# Patient Record
Sex: Male | Born: 1960 | Race: White | Hispanic: No | Marital: Married | Smoking: Never smoker
Health system: Southern US, Community
[De-identification: ages and names within clinical notes are randomized; demographics above are authoritative.]

## PROBLEM LIST (undated history)

## (undated) DIAGNOSIS — E291 Testicular hypofunction: Secondary | ICD-10-CM

## (undated) DIAGNOSIS — I639 Cerebral infarction, unspecified: Secondary | ICD-10-CM

## (undated) DIAGNOSIS — E039 Hypothyroidism, unspecified: Secondary | ICD-10-CM

## (undated) DIAGNOSIS — M47816 Spondylosis without myelopathy or radiculopathy, lumbar region: Secondary | ICD-10-CM

## (undated) DIAGNOSIS — E785 Hyperlipidemia, unspecified: Secondary | ICD-10-CM

## (undated) DIAGNOSIS — N183 Chronic kidney disease, stage 3 unspecified: Secondary | ICD-10-CM

## (undated) DIAGNOSIS — E079 Disorder of thyroid, unspecified: Secondary | ICD-10-CM

## (undated) DIAGNOSIS — M19011 Primary osteoarthritis, right shoulder: Secondary | ICD-10-CM

## (undated) DIAGNOSIS — J3081 Allergic rhinitis due to animal (cat) (dog) hair and dander: Secondary | ICD-10-CM

## (undated) DIAGNOSIS — J45909 Unspecified asthma, uncomplicated: Secondary | ICD-10-CM

## (undated) HISTORY — DX: Hypothyroidism, unspecified: E03.9

## (undated) HISTORY — DX: Primary osteoarthritis, right shoulder: M19.011

## (undated) HISTORY — DX: Hyperlipidemia, unspecified: E78.5

## (undated) HISTORY — DX: Unspecified asthma, uncomplicated: J45.909

## (undated) HISTORY — DX: Allergic rhinitis due to animal (cat) (dog) hair and dander: J30.81

## (undated) HISTORY — DX: Spondylosis without myelopathy or radiculopathy, lumbar region: M47.816

## (undated) HISTORY — DX: Testicular hypofunction: E29.1

## (undated) HISTORY — DX: Disorder of thyroid, unspecified: E07.9

## (undated) HISTORY — DX: Chronic kidney disease, stage 3 unspecified: N18.30

## (undated) HISTORY — PX: TRANSTHORACIC ECHOCARDIOGRAM: SHX275

## (undated) HISTORY — DX: Cerebral infarction, unspecified: I63.9

---

## 2000-01-16 HISTORY — PX: KNEE ARTHROSCOPY: SUR90

## 2000-06-14 ENCOUNTER — Encounter: Payer: Self-pay | Admitting: Internal Medicine

## 2000-06-14 ENCOUNTER — Ambulatory Visit (HOSPITAL_COMMUNITY): Admission: RE | Admit: 2000-06-14 | Discharge: 2000-06-14 | Payer: Self-pay | Admitting: Internal Medicine

## 2001-03-26 ENCOUNTER — Ambulatory Visit (HOSPITAL_COMMUNITY): Admission: RE | Admit: 2001-03-26 | Discharge: 2001-03-26 | Payer: Self-pay | Admitting: Family Medicine

## 2001-03-26 ENCOUNTER — Encounter: Payer: Self-pay | Admitting: Family Medicine

## 2001-08-28 ENCOUNTER — Encounter: Payer: Self-pay | Admitting: Family Medicine

## 2001-08-28 ENCOUNTER — Ambulatory Visit (HOSPITAL_COMMUNITY): Admission: RE | Admit: 2001-08-28 | Discharge: 2001-08-28 | Payer: Self-pay | Admitting: Family Medicine

## 2003-01-11 ENCOUNTER — Ambulatory Visit (HOSPITAL_COMMUNITY): Admission: RE | Admit: 2003-01-11 | Discharge: 2003-01-11 | Payer: Self-pay | Admitting: Family Medicine

## 2003-01-27 ENCOUNTER — Ambulatory Visit (HOSPITAL_COMMUNITY): Admission: RE | Admit: 2003-01-27 | Discharge: 2003-01-27 | Payer: Self-pay | Admitting: Family Medicine

## 2004-05-29 ENCOUNTER — Ambulatory Visit (HOSPITAL_COMMUNITY): Admission: RE | Admit: 2004-05-29 | Discharge: 2004-05-29 | Payer: Self-pay | Admitting: Family Medicine

## 2008-01-17 ENCOUNTER — Emergency Department (HOSPITAL_COMMUNITY): Admission: EM | Admit: 2008-01-17 | Discharge: 2008-01-17 | Payer: Self-pay | Admitting: Emergency Medicine

## 2008-11-18 ENCOUNTER — Ambulatory Visit (HOSPITAL_COMMUNITY): Admission: RE | Admit: 2008-11-18 | Discharge: 2008-11-18 | Payer: Self-pay | Admitting: Family Medicine

## 2010-02-04 ENCOUNTER — Encounter: Payer: Self-pay | Admitting: Family Medicine

## 2010-02-05 ENCOUNTER — Encounter: Payer: Self-pay | Admitting: Family Medicine

## 2012-05-21 ENCOUNTER — Ambulatory Visit (INDEPENDENT_AMBULATORY_CARE_PROVIDER_SITE_OTHER): Payer: BC Managed Care – PPO | Admitting: General Surgery

## 2013-06-01 ENCOUNTER — Telehealth: Payer: Self-pay

## 2013-06-01 NOTE — Telephone Encounter (Signed)
Pt was calling to speak with triage nurse about letter to set up colonoscopy. Please call him back at [phone]

## 2013-06-02 NOTE — Telephone Encounter (Signed)
Tried to call had to leave a message

## 2013-06-03 ENCOUNTER — Encounter: Payer: Self-pay | Admitting: Internal Medicine

## 2013-06-24 NOTE — Telephone Encounter (Signed)
Per [place], pt said he has scheduled colonoscopy with [place], he lives in [place].

## 2013-07-21 ENCOUNTER — Ambulatory Visit (AMBULATORY_SURGERY_CENTER): Payer: Self-pay

## 2013-07-21 VITALS — Ht 74.0 in | Wt 225.0 lb

## 2013-07-21 DIAGNOSIS — Z1211 Encounter for screening for malignant neoplasm of colon: Secondary | ICD-10-CM

## 2013-07-21 MED ORDER — MOVIPREP 100 G PO SOLR: 1.0000 | Freq: Once | ORAL | Status: DC

## 2013-07-21 NOTE — Progress Notes (Signed)
No allergies to eggs or soy No past problems with anesthesia No relatives with problems with anesthesia No home oxygen No diet/weight loss meds  Has email  Emmi instructions given for colonosc

## 2013-08-04 ENCOUNTER — Encounter: Payer: Self-pay | Admitting: Internal Medicine

## 2014-05-18 ENCOUNTER — Other Ambulatory Visit (HOSPITAL_COMMUNITY): Payer: Self-pay | Admitting: Physician Assistant

## 2014-05-18 DIAGNOSIS — K469 Unspecified abdominal hernia without obstruction or gangrene: Secondary | ICD-10-CM

## 2014-05-20 ENCOUNTER — Other Ambulatory Visit (HOSPITAL_COMMUNITY): Payer: PRIVATE HEALTH INSURANCE

## 2014-05-24 ENCOUNTER — Ambulatory Visit (HOSPITAL_COMMUNITY): Payer: PRIVATE HEALTH INSURANCE

## 2014-06-29 ENCOUNTER — Encounter: Payer: No Typology Code available for payment source | Attending: Physician Assistant | Admitting: Dietician

## 2014-06-29 ENCOUNTER — Encounter: Payer: Self-pay | Admitting: Dietician

## 2014-06-29 VITALS — Ht 74.0 in | Wt 221.0 lb

## 2014-06-29 DIAGNOSIS — E785 Hyperlipidemia, unspecified: Secondary | ICD-10-CM | POA: Insufficient documentation

## 2014-06-29 DIAGNOSIS — Z713 Dietary counseling and surveillance: Secondary | ICD-10-CM | POA: Diagnosis not present

## 2014-06-29 NOTE — Patient Instructions (Addendum)
Try switch to diet or sugar free soda, energy drink, or have unsweet tea with splenda or stevia.  Have sausage/bacon about 1-2 x week with breakfast. Aim to fill half of your plate with vegetables at lunch and dinner. Keep a cooler/other food options packed when you go out in the morning. (Protein, carbohydrates, cut up vegetables). Try to eat about every 3-5 hours during the day.  Consider talking to doctor again about sleep at next appointment.
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
## 2016-03-23 ENCOUNTER — Ambulatory Visit: Payer: No Typology Code available for payment source | Admitting: Family Medicine

## 2016-05-11 ENCOUNTER — Other Ambulatory Visit (HOSPITAL_COMMUNITY): Payer: Self-pay | Admitting: Internal Medicine

## 2016-05-11 DIAGNOSIS — M5416 Radiculopathy, lumbar region: Secondary | ICD-10-CM

## 2016-05-17 ENCOUNTER — Ambulatory Visit (HOSPITAL_COMMUNITY): Payer: No Typology Code available for payment source

## 2016-05-17 ENCOUNTER — Encounter (HOSPITAL_COMMUNITY): Payer: Self-pay

## 2016-11-27 ENCOUNTER — Other Ambulatory Visit (HOSPITAL_COMMUNITY): Payer: Self-pay | Admitting: Physician Assistant

## 2016-11-27 ENCOUNTER — Ambulatory Visit (HOSPITAL_COMMUNITY)
Admission: RE | Admit: 2016-11-27 | Discharge: 2016-11-27 | Disposition: A | Discharge status: Home / Self Care | Payer: No Typology Code available for payment source | Source: Ambulatory Visit | Attending: Physician Assistant | Admitting: Physician Assistant

## 2016-11-27 DIAGNOSIS — S83412A Sprain of medial collateral ligament of left knee, initial encounter: Secondary | ICD-10-CM | POA: Insufficient documentation

## 2016-11-27 DIAGNOSIS — M2392 Unspecified internal derangement of left knee: Secondary | ICD-10-CM | POA: Diagnosis present

## 2016-11-27 DIAGNOSIS — S83212A Bucket-handle tear of medial meniscus, current injury, left knee, initial encounter: Secondary | ICD-10-CM | POA: Insufficient documentation

## 2016-11-27 DIAGNOSIS — M25462 Effusion, left knee: Secondary | ICD-10-CM | POA: Diagnosis not present

## 2016-11-27 DIAGNOSIS — R600 Localized edema: Secondary | ICD-10-CM | POA: Diagnosis not present

## 2021-03-02 ENCOUNTER — Emergency Department (HOSPITAL_COMMUNITY): Payer: PRIVATE HEALTH INSURANCE

## 2021-03-02 ENCOUNTER — Emergency Department (HOSPITAL_BASED_OUTPATIENT_CLINIC_OR_DEPARTMENT_OTHER): Payer: PRIVATE HEALTH INSURANCE | Admitting: Radiology

## 2021-03-02 ENCOUNTER — Emergency Department (HOSPITAL_BASED_OUTPATIENT_CLINIC_OR_DEPARTMENT_OTHER)
Admission: EM | Admit: 2021-03-02 | Discharge: 2021-03-02 | Disposition: A | Discharge status: Home / Self Care | Payer: PRIVATE HEALTH INSURANCE | Attending: Emergency Medicine | Admitting: Emergency Medicine

## 2021-03-02 ENCOUNTER — Encounter (HOSPITAL_BASED_OUTPATIENT_CLINIC_OR_DEPARTMENT_OTHER): Payer: Self-pay | Admitting: *Deleted

## 2021-03-02 ENCOUNTER — Emergency Department (HOSPITAL_BASED_OUTPATIENT_CLINIC_OR_DEPARTMENT_OTHER): Payer: PRIVATE HEALTH INSURANCE

## 2021-03-02 ENCOUNTER — Other Ambulatory Visit: Payer: Self-pay

## 2021-03-02 DIAGNOSIS — R251 Tremor, unspecified: Secondary | ICD-10-CM | POA: Insufficient documentation

## 2021-03-02 DIAGNOSIS — R4182 Altered mental status, unspecified: Secondary | ICD-10-CM | POA: Diagnosis not present

## 2021-03-02 DIAGNOSIS — R42 Dizziness and giddiness: Secondary | ICD-10-CM | POA: Insufficient documentation

## 2021-03-02 DIAGNOSIS — R55 Syncope and collapse: Secondary | ICD-10-CM | POA: Insufficient documentation

## 2021-03-02 DIAGNOSIS — I10 Essential (primary) hypertension: Secondary | ICD-10-CM | POA: Insufficient documentation

## 2021-03-02 DIAGNOSIS — Z20822 Contact with and (suspected) exposure to covid-19: Secondary | ICD-10-CM | POA: Diagnosis not present

## 2021-03-02 DIAGNOSIS — J45909 Unspecified asthma, uncomplicated: Secondary | ICD-10-CM | POA: Insufficient documentation

## 2021-03-02 DIAGNOSIS — R110 Nausea: Secondary | ICD-10-CM | POA: Diagnosis not present

## 2021-03-02 DIAGNOSIS — G252 Other specified forms of tremor: Secondary | ICD-10-CM

## 2021-03-02 LAB — COMPREHENSIVE METABOLIC PANEL
ALT: 23 U/L (ref 0–44)
AST: 20 U/L (ref 15–41)
Albumin: 4 g/dL (ref 3.5–5.0)
Alkaline Phosphatase: 31 U/L — ABNORMAL LOW (ref 38–126)
Anion gap: 8 (ref 5–15)
BUN: 11 mg/dL (ref 6–20)
CO2: 30 mmol/L (ref 22–32)
Calcium: 8.6 mg/dL — ABNORMAL LOW (ref 8.9–10.3)
Chloride: 106 mmol/L (ref 98–111)
Creatinine, Ser: 1.54 mg/dL — ABNORMAL HIGH (ref 0.61–1.24)
GFR, Estimated: 51 mL/min — ABNORMAL LOW (ref >60)
Glucose, Bld: 98 mg/dL (ref 70–99)
Potassium: 4.3 mmol/L (ref 3.5–5.1)
Sodium: 144 mmol/L (ref 135–145)
Total Bilirubin: 0.8 mg/dL (ref 0.3–1.2)
Total Protein: 6.3 g/dL — ABNORMAL LOW (ref 6.5–8.1)

## 2021-03-02 LAB — CBC WITH DIFFERENTIAL/PLATELET
Abs Immature Granulocytes: 0.04 10*3/uL (ref 0.00–0.07)
Basophils Absolute: 0.1 10*3/uL (ref 0.0–0.1)
Basophils Relative: 1 %
Eosinophils Absolute: 0.2 10*3/uL (ref 0.0–0.5)
Eosinophils Relative: 2 %
HCT: 52.4 % — ABNORMAL HIGH (ref 39.0–52.0)
Hemoglobin: 16.6 g/dL (ref 13.0–17.0)
Immature Granulocytes: 0 %
Lymphocytes Relative: 4 %
Lymphs Abs: 0.4 10*3/uL — ABNORMAL LOW (ref 0.7–4.0)
MCH: 29.2 pg (ref 26.0–34.0)
MCHC: 31.7 g/dL (ref 30.0–36.0)
MCV: 92.3 fL (ref 80.0–100.0)
Monocytes Absolute: 0.6 10*3/uL (ref 0.1–1.0)
Monocytes Relative: 6 %
Neutro Abs: 8.3 10*3/uL — ABNORMAL HIGH (ref 1.7–7.7)
Neutrophils Relative %: 87 %
Platelets: 280 10*3/uL (ref 150–400)
RBC: 5.68 MIL/uL (ref 4.22–5.81)
RDW: 14.1 % (ref 11.5–15.5)
WBC: 9.5 10*3/uL (ref 4.0–10.5)
nRBC: 0 % (ref 0.0–0.2)

## 2021-03-02 LAB — URINALYSIS, ROUTINE W REFLEX MICROSCOPIC
Bilirubin Urine: NEGATIVE
Glucose, UA: NEGATIVE mg/dL
Ketones, ur: NEGATIVE mg/dL
Leukocytes,Ua: NEGATIVE
Nitrite: NEGATIVE
Protein, ur: 30 mg/dL — AB
Specific Gravity, Urine: 1.023 (ref 1.005–1.030)
pH: 6.5 (ref 5.0–8.0)

## 2021-03-02 LAB — TROPONIN I (HIGH SENSITIVITY)
Troponin I (High Sensitivity): 12 ng/L (ref <18)
Troponin I (High Sensitivity): 13 ng/L (ref <18)

## 2021-03-02 LAB — RESP PANEL BY RT-PCR (FLU A&B, COVID) ARPGX2
Influenza A by PCR: NEGATIVE
Influenza B by PCR: NEGATIVE
SARS Coronavirus 2 by RT PCR: NEGATIVE

## 2021-03-02 LAB — RAPID URINE DRUG SCREEN, HOSP PERFORMED
Amphetamines: NOT DETECTED
Barbiturates: NOT DETECTED
Benzodiazepines: NOT DETECTED
Cocaine: NOT DETECTED
Opiates: NOT DETECTED
Tetrahydrocannabinol: NOT DETECTED

## 2021-03-02 LAB — MAGNESIUM: Magnesium: 2.1 mg/dL (ref 1.7–2.4)

## 2021-03-02 LAB — CBG MONITORING, ED: Glucose-Capillary: 86 mg/dL (ref 70–99)

## 2021-03-02 LAB — TSH: TSH: 4.05 u[IU]/mL (ref 0.350–4.500)

## 2021-03-02 LAB — LIPASE, BLOOD: Lipase: 31 U/L (ref 11–51)

## 2021-03-02 MED ORDER — IOHEXOL 350 MG/ML SOLN
75.0000 mL | Freq: Once | INTRAVENOUS | Status: AC | PRN
  Administered 2021-03-02: 75 mL via INTRAVENOUS

## 2021-03-02 MED ORDER — SODIUM CHLORIDE 0.9 % IV BOLUS
500.0000 mL | Freq: Once | INTRAVENOUS | Status: AC
  Administered 2021-03-02: 500 mL via INTRAVENOUS

## 2021-03-02 NOTE — ED Notes (Signed)
Patient verbalizes understanding of d/c instructions. Opportunities for questions and answers were provided. Pt d/c from ED and ambulated to lobby with wife.  

## 2021-03-02 NOTE — ED Provider Notes (Signed)
[facility] EMERGENC[facility] DEPT Provider Note   CSN: [id number] Arrival date & time: 03/02/21  1024     History  Chief Complaint  Patient presents with   Dizziness    Luis Whitaker is a 61 y.o. male.  61 yo man presents with complaint of nausea, dizziness, and near syncope. 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 He became very dizzy (not positional), and sat down. He started sweating and had an episode of emesis. He decided to leave work and went home to lie down. His wife convinced him to come in to be examined. Patient reports that he has had similar shaking and sweating episodes for several weeks to months. They typically occur at night. He has had intense night sweats to the point of having to change clothes/sheets. He states that he feels "wobbly" today. His PMH is only significant for HTN and asthma. He has not followed with a primary care doctor in several years. He denies tobacco use, EtOH, or other recreational drugs.      Home Medications Prior to Admission medications   Medication Sig Start Date End Date Taking? Authorizing Provider  albuterol (PROVENTIL HFA;VENTOLIN HFA) 108 (90 BASE) MCG/ACT inhaler Inhale 1-2 puffs into the lungs every 6 (six) hours as needed for wheezing or shortness of breath (seasonal with pollen).    [provider]  Hydrocodone-Acetaminophen (LORCET PO) Take by mouth.    [provider]  Montelukast Sodium (SINGULAIR PO) Take 10 mg by mouth daily.    [provider]  MOVIPREP 100 G SOLR Take 1 kit (200 g total) by mouth once. 07/21/13   Pyrtle, Lajuan Lines, MD  Multiple Vitamins-Minerals (CENTRUM SILVER PO) Take 1 tablet by mouth daily.    [provider]      Allergies    Patient has no known allergies.    Review of Systems   Review of Systems  Constitutional:  Positive for chills and fatigue. Negative  for appetite change, fever and unexpected weight change.  HENT:  Negative for postnasal drip, rhinorrhea, sinus pressure, sinus pain, sneezing and sore throat.   Eyes:  Negative for photophobia and visual disturbance.  Respiratory:  Negative for cough, shortness of breath and wheezing.   Cardiovascular:  Negative for chest pain, palpitations and leg swelling.  Gastrointestinal:  Positive for nausea and vomiting. Negative for abdominal distention, abdominal pain, constipation and diarrhea.  Genitourinary:        Reports 2-3 episodes of nocturia regularly  Skin:  Negative for color change and pallor.  Neurological:  Positive for dizziness, tremors and light-headedness. Negative for seizures, syncope, facial asymmetry, speech difficulty, weakness, numbness and headaches.  Psychiatric/Behavioral:  Negative for agitation, behavioral problems, confusion, decreased concentration and dysphoric mood.   All other systems reviewed and are negative.  Physical Exam Updated Vital Signs BP (!) 145/97    Pulse 62    Temp 98.7 F (37.1 C) (Oral)    Resp 17    Wt 104.3 kg    SpO2 96%    BMI 29.53 kg/m  Physical Exam Vitals and nursing note reviewed.  Constitutional:      General: He is not in acute distress.    Appearance: Normal appearance. He is normal weight. He is not ill-appearing, toxic-appearing or diaphoretic.  HENT:     Head: Normocephalic and atraumatic.     Nose: Nose normal.  No congestion or rhinorrhea.     Mouth/Throat:     Mouth: Mucous membranes are moist.     Pharynx: Oropharynx is clear. No oropharyngeal exudate or posterior oropharyngeal erythema.  Eyes:     General: No scleral icterus.       Right eye: No discharge.        Left eye: No discharge.     Extraocular Movements: Extraocular movements intact.     Conjunctiva/sclera: Conjunctivae normal.     Pupils: Pupils are equal, round, and reactive to light.  Cardiovascular:     Rate and Rhythm: Normal rate and regular rhythm.      Pulses: Normal pulses.     Heart sounds: Normal heart sounds. No murmur heard.   No friction rub. No gallop.  Pulmonary:     Effort: Pulmonary effort is normal.     Breath sounds: Normal breath sounds.  Abdominal:     General: Abdomen is flat. Bowel sounds are normal.     Palpations: Abdomen is soft. There is no mass.     Tenderness: There is no abdominal tenderness. There is no right CVA tenderness, left CVA tenderness or guarding.  Musculoskeletal:     Cervical back: Normal range of motion and neck supple. No rigidity.     Right lower leg: No edema.     Left lower leg: No edema.  Lymphadenopathy:     Cervical: No cervical adenopathy.  Skin:    General: Skin is warm and dry.     Capillary Refill: Capillary refill takes less than 2 seconds.     Coloration: Skin is not pale.     Findings: No erythema.  Neurological:     Mental Status: He is alert and oriented to person, place, and time. Mental status is at baseline.     Cranial Nerves: No cranial nerve deficit.     Sensory: No sensory deficit.     Motor: No weakness.     Coordination: Coordination normal.     Deep Tendon Reflexes: Reflexes normal.     Comments: Romberg positive falling back, no pronator drift, broad based gait  Psychiatric:        Mood and Affect: Mood normal.        Behavior: Behavior normal.    ED Results / Procedures / Treatments   Labs (all labs ordered are listed, but only abnormal results are displayed) Labs Reviewed  COMPREHENSIVE METABOLIC PANEL - Abnormal; Notable for the following components:      Result Value   Creatinine, Ser 1.54 (*)    Calcium 8.6 (*)    Total Protein 6.3 (*)    Alkaline Phosphatase 31 (*)    GFR, Estimated 51 (*)    All other components within normal limits  CBC WITH DIFFERENTIAL/PLATELET - Abnormal; Notable for the following components:   HCT 52.4 (*)    Neutro Abs 8.3 (*)    Lymphs Abs 0.4 (*)    All other components within normal limits  URINALYSIS, ROUTINE W REFLEX  MICROSCOPIC - Abnormal; Notable for the following components:   Hgb urine dipstick TRACE (*)    Protein, ur 30 (*)    Bacteria, UA FEW (*)    All other components within normal limits  RESP PANEL BY RT-PCR (FLU A&B, COVID) ARPGX2  URINE CULTURE  LIPASE, BLOOD  MAGNESIUM  RAPID URINE DRUG SCREEN, HOSP PERFORMED  TSH  CBG MONITORING, ED  TROPONIN I (HIGH SENSITIVITY)  TROPONIN I (HIGH SENSITIVITY)  EKG EKG Interpretation  Date/Time:  Thursday March 02 2021 10:34:59 EST Ventricular Rate:  64 PR Interval:  214 QRS Duration: 126 QT Interval:  404 QTC Calculation: 417 R Axis:   10 Text Interpretation: Sinus rhythm Prolonged PR interval IVCD, consider atypical RBBB Nonspecific ST change No prior tracings for comparison Confirmed by Nanda Quinton [phone]) on 03/02/2021 10:55:37 AM  Radiology DG Chest 2 View  Result Date: 03/02/2021 CLINICAL DATA:  Dizziness, near syncope EXAM: CHEST - 2 VIEW COMPARISON:  11/18/2008 FINDINGS: The heart size and mediastinal contours are within normal limits. Both lungs are clear. The visualized skeletal structures are unremarkable. IMPRESSION: No active cardiopulmonary disease. Electronically Signed   By: Elmer Picker M.D.   On: 03/02/2021 11:41   CT Head Wo Contrast  Result Date: 03/02/2021 CLINICAL DATA:  Altered mental status, dizziness EXAM: CT HEAD WITHOUT CONTRAST TECHNIQUE: Contiguous axial images were obtained from the base of the skull through the vertex without intravenous contrast. RADIATION DOSE REDUCTION: This exam was performed according to the departmental dose-optimization program which includes automated exposure control, adjustment of the mA and/or kV according to patient size and/or use of iterative reconstruction technique. COMPARISON:  None. FINDINGS: Brain: No acute intracranial findings are seen. Ventricles are not dilated. Cortical sulci are prominent. There is no focal mass effect. Vascular: Unremarkable. Skull: No fracture  is seen. Sinuses/Orbits: Unremarkable. Other: None IMPRESSION: No acute intracranial findings are seen.  Atrophy. Electronically Signed   By: Elmer Picker M.D.   On: 03/02/2021 11:41    Procedures Procedures    Medications Ordered in ED Medications  sodium chloride 0.9 % bolus 500 mL (has no administration in time range)  sodium chloride 0.9 % bolus 500 mL (0 mLs Intravenous Stopped 03/02/21 1223)    ED Course/ Medical Decision Making/ A&P Clinical Course as of 03/02/21 1235  Thu Mar 02, 2021  1158 CBC reasurringly WNL, Lapse, Mg, and first troponin all WNL. CMP with decreased protein and mildly elevated s cr. Unclear what to make of s cr, less likely dehydration given normal BUN. No previous cr to compare to for baseline, could be AKI. Will give fluids and will need reassesssment. [CM]  9892 Urine with trace hematuria, protein, and bacteria. Will get urine culture. May need protein electrophoresis. [CM]  1194 CXR and CT head with no acute findings. [CM]  1223 Due to significant findings on neuro exam, will recommend transfer to [facility] for MRI brain. Dr. Laverta Baltimore discussed with Dr. Billy Fischer, who accepted. [CM]  28 COVID, flu, and UDS negative [CM]    Clinical Course User Index [CM] Gladys Damme, MD                           Medical Decision Making 61 yo man with no significant PMH who presents with insidious onset of shaking, night sweats, with an episode of near-syncope, dizziness, and emesis today. Ddx is very broad, including cardiac etiology (EKG no STEMI, will obtain trops, cardiac monitoring for arrhythmia- currently NSR), neurologic etiology (posterior/vertebral stroke, TIA, dorsal column pathology with romberg), metabolic causes (with night sweats, will obtain CMP, dizziness will obtain CBC, will also get TSH, UA - esp consider insidious group B symptoms from insidious malignancy like MM/Waldentsromm's; glucose normal), ingestion (UDS pending), or infectious (CXR, low risk  for TB exposure).  Amount and/or Complexity of Data Reviewed Independent Historian: spouse External Data Reviewed: notes. Labs: ordered. Radiology: ordered. ECG/medicine tests: ordered.  Final Clinical Impression(s) / ED Diagnoses Final diagnoses:  Dizziness  Resting tremor    Rx / DC Orders ED Discharge Orders     None         Gladys Damme, MD 03/02/21 1235    Margette Fast, MD 03/02/21 1342

## 2021-03-02 NOTE — ED Provider Notes (Signed)
Care assumed from previous provider pending MRI brain. See their note for full details.  In short, patient is a 61 year old male who presents to the ED due to dizziness, nausea, near syncope.  Patient had a positive Romberg exam.  On physical exam during his initial evaluation patient had a tremor and goal ataxia without any clear focal neurodeficits.  CT head negative for any acute abnormalities.  [facility] transferred to [facility] ED for MRI brain.  Previous attending saw patient and noted that if MRI is negative he felt comfortable patient being discharged with PCP and neurology follow-up. Physical Exam  BP 140/85    Pulse 68    Temp 98.2 F (36.8 C)    Resp 20    Wt 104.3 kg    SpO2 99%    BMI 29.53 kg/m   Physical Exam Vitals and nursing note reviewed.  Constitutional:      General: He is not in acute distress.    Appearance: He is not ill-appearing.  HENT:     Head: Normocephalic.  Eyes:     Pupils: Pupils are equal, round, and reactive to light.  Cardiovascular:     Rate and Rhythm: Normal rate and regular rhythm.     Pulses: Normal pulses.     Heart sounds: Normal heart sounds. No murmur heard.   No friction rub. No gallop.  Pulmonary:     Effort: Pulmonary effort is normal.     Breath sounds: Normal breath sounds.  Abdominal:     General: Abdomen is flat. There is no distension.     Palpations: Abdomen is soft.     Tenderness: There is no abdominal tenderness. There is no guarding or rebound.  Musculoskeletal:        General: Normal range of motion.     Cervical back: Neck supple.  Skin:    General: Skin is warm and dry.  Neurological:     General: No focal deficit present.     Mental Status: He is alert and oriented to person, place, and time.     Cranial Nerves: No cranial nerve deficit.     Comments: +Romberg  Psychiatric:        Mood and Affect: Mood normal.        Behavior: Behavior normal.    Procedures  Procedures  ED Course / MDM   Clinical Course as  of 03/02/21 1727  Thu Mar 02, 2021  1158 CBC reasurringly WNL, Lapse, Mg, and first troponin all WNL. CMP with decreased protein and mildly elevated s cr. Unclear what to make of s cr, less likely dehydration given normal BUN. No previous cr to compare to for baseline, could be AKI. Will give fluids and will need reassesssment. [CM]  [id number] Urine with trace hematuria, protein, and bacteria. Will get urine culture. May need protein electrophoresis. [CM]  [id number] CXR and CT head with no acute findings. [CM]  1223 Due to significant findings on neuro exam, will recommend transfer to [facility] for MRI brain. Dr. Laverta Baltimore discussed with Dr. Billy Fischer, who accepted. [CM]  [id number] COVID, flu, and UDS negative [CM]    Clinical Course User Index [CM] Gladys Damme, MD   Medical Decision Making Amount and/or Complexity of Data Reviewed Independent Historian: spouse    Details: Wife at bedside provided history External Data Reviewed: notes. Labs: ordered. Radiology: ordered and independent interpretation performed. Decision-making details documented in ED Course.  Risk Prescription drug management.   61 year old male presents to the ED due  to dizziness.  Patient transferred to [facility] for MRI brain.  See previous notes for full details.   6:06 PM assessed patient at bedside. +Romberg. No other focal deficits  MRI demonstrates  IMPRESSION: 1. No evidence of acute intracranial abnormality. 2. 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 Tiny remote infarct in the right cerebellum.    7:29 PM Discussed MRI results with Dr. Lorrin Goodell with neurology who recommends CTA head/neck. Will reconsult after results are available for further recommends. Discussed results with patient and wife at bedside.   CTA head and neck negative for any emergent large vessel occlusions or high-grade stenosis. Discussed with Dr. Lorrin Goodell who will see patient.  Upon  reassessment, patient notes he would prefer to go home instead of speak to neurology here. Given no acute CVA and normal CTA head/neck feel this is reasonable. Ambulatory referral placed for neurology. Strict ED precautions discussed with patient. Patient states understanding and agrees to plan. Patient discharged home in no acute distress and stable vitals.  Discussed with Dr. Ronnald Nian who agrees with assessment and plan.        Suzy Bouchard, PA-C 03/02/21 [place], [place], DO 03/02/21 2336

## 2021-03-02 NOTE — ED Notes (Signed)
PT is in MRI . They will bring him when he is done there.

## 2021-03-02 NOTE — ED Notes (Signed)
Patient attempting a urine sample at this time

## 2021-03-02 NOTE — ED Triage Notes (Signed)
Pt states that for several months he has been having shakes and dizziness and night sweats.  Pt wife is here and states that he would not go to the doctor to have this evaluated.  This am he became dizzy, sweaty and vomited. No LOC and no focal weakness of CP or sob with this.

## 2021-03-02 NOTE — ED Notes (Signed)
Blood sent to lab (light green, purple and blue)

## 2021-03-02 NOTE — Discharge Instructions (Addendum)
You were seen in the emergency department today with dizziness and a tremor.  MRI showed previous strokes. I have placed a referral to the neurologist. Expect a phone call within the next week. If you do not hear from them, call to schedule an appointment. Return to the ER for new or worsening symptoms.

## 2021-03-02 NOTE — ED Notes (Signed)
IV secured for POV transfer, patient instructed on where to go. Stable at this time

## 2021-03-03 LAB — URINE CULTURE: Culture: NO GROWTH

## 2021-03-09 ENCOUNTER — Encounter: Payer: Self-pay | Admitting: Neurology

## 2021-03-09 ENCOUNTER — Ambulatory Visit (INDEPENDENT_AMBULATORY_CARE_PROVIDER_SITE_OTHER): Payer: PRIVATE HEALTH INSURANCE | Admitting: Neurology

## 2021-03-09 VITALS — BP 144/87 | Ht 74.0 in | Wt 232.0 lb

## 2021-03-09 DIAGNOSIS — I63412 Cerebral infarction due to embolism of left middle cerebral artery: Secondary | ICD-10-CM | POA: Diagnosis not present

## 2021-03-09 DIAGNOSIS — I639 Cerebral infarction, unspecified: Secondary | ICD-10-CM

## 2021-03-09 DIAGNOSIS — R42 Dizziness and giddiness: Secondary | ICD-10-CM | POA: Diagnosis not present

## 2021-03-09 NOTE — Progress Notes (Signed)
[facility] [address] [place]. [place] [address] [phone]       OFFICE CONSULT NOTE  Mr. Luis Whitaker Date of Birth:  1960-09-05 Medical Record Number:  [id number]   Referring MD: Charmaine Downs, PA-C  Reason for Referral: Abnormal MRI scan  HPI: Mr. Luis Whitaker is a 61 year old pleasant Caucasian male seen today for initial office consultation visit.  He is accompanied by his wife.  History is obtained from them and review of electronic medical records and opossum reviewed pertinent available imaging films in PACS.  He has no significant past medical history except hyperlipidemia and asthma.  He presented to the ER on 03/02/2021 with several months history of intermittent episode of transient shakes, dizziness and night sweats.  He feels that his balance is off and he may fall down and has to hold on to something and sit or stand.  He is at times has nausea and vomits.  There has been no loss of consciousness, seizure activity, headache, vertigo, focal extremity weakness or numbness or loss of vision.  He denies any prior history of stroke or TIA but MRI scan of the brain on 03/02/2021 showed old left parieto-occipital area of encephalomalacia and gliosis as well as small area of gliosis in left parietal lobe also consistent with remote age infarct.  As third area of right cerebellar infarct has been mentioned by the radiologist but upon my review of the images it appears to be dilated CSF space.  CT angiogram of the brain and neck were both obtained and showed no significant large vessel intracranial stenosis or extracranial occlusion.  He has not had any recent lipid profile hemoglobin A1c checked.  Urine drug screen was negative.  WBC count was unremarkable.  Comprehensive metabolic panel was significant only for slightly elevated creatinine of 1.54.  UA was unremarkable.  Patient denies any history of palpitations, atrial fibrillation, cardiac arrhythmias, syncopal episodes.   He has no history of coronary artery disease or cardiac stents or heart attacks.  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  He denies history of snoring or obstructive sleep apnea.  He is stated he had some concerns several years ago but then lost a lot of weight and this is no longer a concern.  There is no family history of strokes or heart attacks at a young age in his family.  ROS:   14 system review of systems is positive for dizziness, tremors, shaking, sweating, night sweats all other systems negative  PMH:  Past Medical History:  Diagnosis Date   Asthma    Cat allergies    Hyperlipidemia     Social History:  Social History   Socioeconomic History   Marital status: Married    Spouse name: Not on file   Number of children: Not on file   Years of education: Not on file   Highest education level: Not on file  Occupational History   Not on file  Tobacco Use   Smoking status: Never   Smokeless tobacco: Never  Substance and Sexual Activity   Alcohol use: No   Drug use: No   Sexual activity: Not on file  Other Topics Concern   Not on file  Social History Narrative   Not on file   Social Determinants of Health   Financial Resource Strain: Not on file  Food Insecurity: Not on file  Transportation Needs: Not on file  Physical Activity: Not on file  Stress: Not  on file  Social Connections: Not on file  Intimate Partner Violence: Not on file    Medications:   Current Outpatient Medications on File Prior to Visit  Medication Sig Dispense Refill   albuterol (PROVENTIL HFA;VENTOLIN HFA) 108 (90 BASE) MCG/ACT inhaler Inhale 1-2 puffs into the lungs every 6 (six) hours as needed for wheezing or shortness of breath (seasonal with pollen).     Multiple Vitamins-Minerals (CENTRUM SILVER PO) Take 1 tablet by mouth daily.     No current facility-administered medications on file prior to visit.    Allergies:  No Known Allergies  Physical Exam General: well  developed, well nourished pleasant middle-age Caucasian male, seated, in no evident distress Head: head normocephalic and atraumatic.   Neck: supple with no carotid or supraclavicular bruits Cardiovascular: regular rate and rhythm, no murmurs Musculoskeletal: no deformity Skin:  no rash/petichiae Vascular:  Normal pulses all extremities  Neurologic Exam Mental Status: Awake and fully alert. Oriented to place and time. Recent and remote memory intact. Attention span, concentration and fund of knowledge appropriate. Mood and affect appropriate.  Cranial Nerves: Fundoscopic exam reveals sharp disc margins. Pupils equal, briskly reactive to light. Extraocular movements full without nystagmus. Visual fields full to confrontation. Hearing intact. Facial sensation intact. Face, tongue, palate moves normally and symmetrically.  Motor: Normal bulk and tone. Normal strength in all tested extremity muscles.  No resting or action tremor. Sensory.: intact to touch , pinprick , position and vibratory sensation.  Coordination: Rapid alternating movements normal in all extremities. Finger-to-nose and heel-to-shin performed accurately bilaterally. Gait and Station: Arises from chair without difficulty. Stance is normal. Gait demonstrates normal stride length and balance . Able to heel, toe and tandem walk without difficulty.  Reflexes: 1+ and symmetric. Toes downgoing.   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     PLAN:I had a long d/w patient and his wife about his recent dizzy spells and MRI findings of silent cryptogenic strokes, risk for recurrent stroke/TIAs, personally independently reviewed imaging studies and stroke evaluation results and  answered questions.Startaspirin 81 mg daily  for secondary stroke prevention and maintain strict control of hypertension with blood pressure goal below 130/90, diabetes with hemoglobin A1c goal below 6.5% and lipids with LDL cholesterol goal below 70 mg/dL. I also advised the patient to eat a healthy diet with plenty of whole grains, cereals, fruits and vegetables, exercise regularly and maintain ideal body weight .check lipid profile, hemoglobin A1c, echocardiogram and refer to cardiology for loop recorder insertion for paroxysmal A-fib.  Also check thyroid function tests and B12.  Followup in the future with me in 3 months or call earlier if necessary.  Greater than 50% time during this 45-minute consultation visit was spent on counseling and coordination of care about his silent cryptogenic strokes and discussion about stroke evaluation, prevention and treatment and answering questions Antony Contras, MD  Note: This document was prepared with digital dictation and possible smart phrase technology. Any transcriptional errors that result from this process are unintentional.

## 2021-03-09 NOTE — Patient Instructions (Signed)
I had a long d/w patient and his wife about his recent dizzy spells and MRI findings of silent cryptogenic strokes, risk for recurrent stroke/TIAs, personally independently reviewed imaging studies and stroke evaluation results and answered questions.Startaspirin 81 mg daily  for secondary stroke prevention and maintain strict control of hypertension with blood pressure goal below 130/90, diabetes with hemoglobin A1c goal below 6.5% and lipids with LDL cholesterol goal below 70 mg/dL. I also advised the patient to eat a healthy diet with plenty of whole grains, cereals, fruits and vegetables, exercise regularly and maintain ideal body weight .check lipid profile, hemoglobin A1c, echocardiogram and refer to cardiology for loop recorder insertion for paroxysmal A-fib.  Also check thyroid function tests and B12.  Followup in the future with me in 3 months or call earlier if necessary. Stroke Prevention Some medical conditions and behaviors can lead to a higher chance of having a stroke. You can help prevent a stroke by eating healthy, exercising, not smoking, and managing any medical conditions you have. Stroke is a leading cause of functional impairment. Primary prevention is particularly important because a majority of strokes are first-time events. Stroke changes the lives of not only those who experience a stroke but also their family and other caregivers. How can this condition affect me? A stroke is a medical emergency and should be treated right away. A stroke can lead to brain damage and can sometimes be life-threatening. If a person gets medical treatment right away, there is a better chance of surviving and recovering from a stroke. What can increase my risk? The following medical conditions may increase your risk of a stroke: Cardiovascular disease. High blood pressure (hypertension). Diabetes. High cholesterol. Sickle cell disease. Blood clotting disorders (hypercoagulable  state). Obesity. Sleep disorders (obstructive sleep apnea). Other risk factors include: Being older than age 5. Having a history of blood clots, stroke, or mini-stroke (transient ischemic attack, TIA). Genetic factors, such as race, ethnicity, or a family history of stroke. Smoking cigarettes or using other tobacco products. Taking birth control pills, especially if you also use tobacco. Heavy use of alcohol or drugs, especially cocaine and methamphetamine. Physical inactivity. What actions can I take to prevent this? Manage your health conditions High cholesterol levels. Eating a healthy diet is important for preventing high cholesterol. If cholesterol cannot be managed through diet alone, you may need to take medicines. Take any prescribed medicines to control your cholesterol as told by your health care provider. Hypertension. To reduce your risk of stroke, try to keep your blood pressure below 130/80. Eating a healthy diet and exercising regularly are important for controlling blood pressure. If these steps are not enough to manage your blood pressure, you may need to take medicines. Take any prescribed medicines to control hypertension as told by your health care provider. Ask your health care provider if you should monitor your blood pressure at home. Have your blood pressure checked every year, even if your blood pressure is normal. Blood pressure increases with age and some medical conditions. Diabetes. Eating a healthy diet and exercising regularly are important parts of managing your blood sugar (glucose). If your blood sugar cannot be managed through diet and exercise, you may need to take medicines. Take any prescribed medicines to control your diabetes as told by your health care provider. Get evaluated for obstructive sleep apnea. Talk to your health care provider about getting a sleep evaluation if you snore a lot or have excessive sleepiness. Make sure that any other  medical  conditions you have, such as atrial fibrillation or atherosclerosis, are managed. Nutrition Follow instructions from your health care provider about what to eat or drink to help manage your health condition. These instructions may include: Reducing your daily calorie intake. Limiting how much salt (sodium) you use to 1,500 milligrams (mg) each day. Using only healthy fats for cooking, such as olive oil, canola oil, or sunflower oil. Eating healthy foods. You can do this by: Choosing foods that are high in fiber, such as whole grains, and fresh fruits and vegetables. Eating at least 5 servings of fruits and vegetables a day. Try to fill one-half of your plate with fruits and vegetables at each meal. Choosing lean protein foods, such as lean cuts of meat, poultry without skin, fish, tofu, beans, and nuts. Eating low-fat dairy products. Avoiding foods that are high in sodium. This can help lower blood pressure. Avoiding foods that have saturated fat, trans fat, and cholesterol. This can help prevent high cholesterol. Avoiding processed and prepared foods. Counting your daily carbohydrate intake.  Lifestyle If you drink alcohol: Limit how much you have to: 0-1 drink a day for women who are not pregnant. 0-2 drinks a day for men. Know how much alcohol is in your drink. In the [place] one drink equals one 12 oz bottle of beer (39mL), one 5 oz glass of wine (164mL), or one 1 oz glass of hard liquor (74mL). Do not use any products that contain nicotine or tobacco. These products include cigarettes, chewing tobacco, and vaping devices, such as e-cigarettes. If you need help quitting, ask your health care provider. Avoid secondhand smoke. Do not use drugs. Activity  Try to stay at a healthy weight. Get at least 30 minutes of exercise on most days, such as: Fast walking. Biking. Swimming. Medicines Take over-the-counter and prescription medicines only as told by your health care  provider. Aspirin or blood thinners (antiplatelets or anticoagulants) may be recommended to reduce your risk of forming blood clots that can lead to stroke. Avoid taking birth control pills. Talk to your health care provider about the risks of taking birth control pills if: You are over 61 years old. You smoke. You get very bad headaches. You have had a blood clot. Where to find more information American Stroke Association: www.strokeassociation.org Get help right away if: You or a loved one has any symptoms of a stroke. "BE FAST" is an easy way to remember the main warning signs of a stroke: B - Balance. Signs are dizziness, sudden trouble walking, or loss of balance. E - Eyes. Signs are trouble seeing or a sudden change in vision. F - Face. Signs are sudden weakness or numbness of the face, or the face or eyelid drooping on one side. A - Arms. Signs are weakness or numbness in an arm. This happens suddenly and usually on one side of the body. S - Speech. Signs are sudden trouble speaking, slurred speech, or trouble understanding what people say. T - Time. Time to call emergency services. Write down what time symptoms started. You or a loved one has other signs of a stroke, such as: A sudden, severe headache with no known cause. Nausea or vomiting. Seizure. These symptoms may represent a serious problem that is an emergency. Do not wait to see if the symptoms will go away. Get medical help right away. Call your local emergency services (911 in the [place] Do not drive yourself to the hospital. Summary You can help to prevent a stroke by  eating healthy, exercising, not smoking, limiting alcohol intake, and managing any medical conditions you may have. Do not use any products that contain nicotine or tobacco. These include cigarettes, chewing tobacco, and vaping devices, such as e-cigarettes. If you need help quitting, ask your health care provider. Remember "BE FAST" for warning signs of a  stroke. Get help right away if you or a loved one has any of these signs. This information is not intended to replace advice given to you by your health care provider. Make sure you discuss any questions you have with your health care provider. Document Revised: 08/03/2019 Document Reviewed: 08/03/2019 Elsevier Patient Education  [place].

## 2021-03-15 ENCOUNTER — Other Ambulatory Visit: Payer: Self-pay | Admitting: *Deleted

## 2021-03-15 ENCOUNTER — Telehealth: Payer: Self-pay | Admitting: Neurology

## 2021-03-15 DIAGNOSIS — I63412 Cerebral infarction due to embolism of left middle cerebral artery: Secondary | ICD-10-CM

## 2021-03-15 DIAGNOSIS — I639 Cerebral infarction, unspecified: Secondary | ICD-10-CM

## 2021-03-15 NOTE — Telephone Encounter (Signed)
Called [employer] insurance plan spoke with Aram Beecham she states PA is not required for the ECHO. Messaged Lupita Leash to get patient scheduled. ?

## 2021-03-15 NOTE — Telephone Encounter (Signed)
Order needs to be corrected in order to get patient scheduled. ?

## 2021-03-15 NOTE — Telephone Encounter (Signed)
When I Kennon Rounds to schedule it she said she couldn't see it, so what I was informed was it needs to be ordered under [facility]. ?

## 2021-03-21 ENCOUNTER — Other Ambulatory Visit: Payer: Self-pay | Admitting: Neurology

## 2021-03-21 ENCOUNTER — Telehealth: Payer: Self-pay | Admitting: *Deleted

## 2021-03-21 LAB — LIPID PANEL
Chol/HDL Ratio: 7.9 ratio — ABNORMAL HIGH (ref 0.0–5.0)
Cholesterol, Total: 189 mg/dL (ref 100–199)
HDL: 24 mg/dL — ABNORMAL LOW (ref >39)
LDL Chol Calc (NIH): 148 mg/dL — ABNORMAL HIGH (ref 0–99)
Triglycerides: 94 mg/dL (ref 0–149)
VLDL Cholesterol Cal: 17 mg/dL (ref 5–40)

## 2021-03-21 LAB — HEMOGLOBIN A1C
Est. average glucose Bld gHb Est-mCnc: 108 mg/dL
Hgb A1c MFr Bld: 5.4 % (ref 4.8–5.6)

## 2021-03-21 LAB — TSH+T3+FREE T4+T3 FREE
Free T-3: 3.4 pg/mL
Free T4 by Dialysis: 0.59 ng/dL — ABNORMAL LOW
TSH: 9.5 uU/mL — ABNORMAL HIGH
Triiodothyronine (T-3), Serum: 86 ng/dL

## 2021-03-21 LAB — VITAMIN B2, WHOLE BLOOD: Vitamin B2, Whole Blood: 307 ug/L (ref 137–370)

## 2021-03-21 MED ORDER — ATORVASTATIN CALCIUM 80 MG PO TABS: 80.0000 mg | ORAL_TABLET | Freq: Every day | ORAL | 11 refills | Status: DC

## 2021-03-21 NOTE — Telephone Encounter (Signed)
-----   Message from Micki Riley, MD sent at 03/21/2021  9:31 AM EST ----- ?Kindly inform the patient that cholesterol profile is quite unsatisfactory with bad cholesterol being elevated and given his abnormal MRI showing silent strokes I would recommend he start cholesterol medicine Lipitor 80 mg daily to lower his cholesterol and reduce risk of future strokes and heart attacks.  Thyroid hormone levels are also abnormal and he needs to see his primary care physician to be started on appropriate thyroid medications.  Vitamin B12 levels are normal.  Screening test for diabetes was satisfactory ?

## 2021-03-21 NOTE — Telephone Encounter (Signed)
Pt verified by name and DOB,  normal results given per provider, pt voiced understanding all question answered.  ? ?Pt agrees with plan, please send in medication to pts pharmacy. ?

## 2021-03-21 NOTE — Progress Notes (Signed)
Kindly inform the patient that cholesterol profile is quite unsatisfactory with bad cholesterol being elevated and given his abnormal MRI showing silent strokes I would recommend he start cholesterol medicine Lipitor 80 mg daily to lower his cholesterol and reduce risk of future strokes and heart attacks.  Thyroid hormone levels are also abnormal and he needs to see his primary care physician to be started on appropriate thyroid medications.  Vitamin B12 levels are normal.  Screening test for diabetes was satisfactory

## 2021-03-30 ENCOUNTER — Other Ambulatory Visit (HOSPITAL_COMMUNITY): Payer: PRIVATE HEALTH INSURANCE

## 2021-04-03 ENCOUNTER — Other Ambulatory Visit: Payer: Self-pay

## 2021-04-03 ENCOUNTER — Ambulatory Visit (HOSPITAL_COMMUNITY)
Admission: RE | Admit: 2021-04-03 | Discharge: 2021-04-03 | Disposition: A | Discharge status: Home / Self Care | Payer: PRIVATE HEALTH INSURANCE | Source: Ambulatory Visit | Attending: Neurology | Admitting: Neurology

## 2021-04-03 DIAGNOSIS — I63412 Cerebral infarction due to embolism of left middle cerebral artery: Secondary | ICD-10-CM | POA: Insufficient documentation

## 2021-04-03 DIAGNOSIS — I517 Cardiomegaly: Secondary | ICD-10-CM | POA: Diagnosis not present

## 2021-04-03 LAB — ECHOCARDIOGRAM COMPLETE
AR max vel: 5.25 cm2
AV Peak grad: 6.1 mmHg
Ao pk vel: 1.24 m/s
Area-P 1/2: 2.73 cm2
S' Lateral: 2.9 cm
Single Plane A4C EF: 42.5 %

## 2021-06-27 ENCOUNTER — Ambulatory Visit: Payer: PRIVATE HEALTH INSURANCE | Admitting: Neurology

## 2022-02-21 ENCOUNTER — Encounter: Payer: Self-pay | Admitting: Family Medicine

## 2022-02-21 ENCOUNTER — Ambulatory Visit (INDEPENDENT_AMBULATORY_CARE_PROVIDER_SITE_OTHER): Payer: 59 | Admitting: Family Medicine

## 2022-02-21 VITALS — BP 135/83 | HR 70 | Temp 98.1°F | Ht 74.0 in | Wt 242.6 lb

## 2022-02-21 DIAGNOSIS — S46001A Unspecified injury of muscle(s) and tendon(s) of the rotator cuff of right shoulder, initial encounter: Secondary | ICD-10-CM | POA: Diagnosis not present

## 2022-02-21 DIAGNOSIS — M25511 Pain in right shoulder: Secondary | ICD-10-CM

## 2022-02-21 DIAGNOSIS — G8929 Other chronic pain: Secondary | ICD-10-CM

## 2022-02-21 DIAGNOSIS — Z125 Encounter for screening for malignant neoplasm of prostate: Secondary | ICD-10-CM

## 2022-02-21 DIAGNOSIS — E7800 Pure hypercholesterolemia, unspecified: Secondary | ICD-10-CM

## 2022-02-21 DIAGNOSIS — M67921 Unspecified disorder of synovium and tendon, right upper arm: Secondary | ICD-10-CM | POA: Diagnosis not present

## 2022-02-21 DIAGNOSIS — M67911 Unspecified disorder of synovium and tendon, right shoulder: Secondary | ICD-10-CM | POA: Diagnosis not present

## 2022-02-21 DIAGNOSIS — Z0000 Encounter for general adult medical examination without abnormal findings: Secondary | ICD-10-CM | POA: Diagnosis not present

## 2022-02-21 DIAGNOSIS — E039 Hypothyroidism, unspecified: Secondary | ICD-10-CM | POA: Diagnosis not present

## 2022-02-21 LAB — CBC WITH DIFFERENTIAL/PLATELET
Basophils Absolute: 0.1 10*3/uL (ref 0.0–0.1)
Basophils Relative: 2.1 % (ref 0.0–3.0)
Eosinophils Absolute: 0.3 10*3/uL (ref 0.0–0.7)
Eosinophils Relative: 4 % (ref 0.0–5.0)
HCT: 49.7 % (ref 39.0–52.0)
Hemoglobin: 16.8 g/dL (ref 13.0–17.0)
Lymphocytes Relative: 8.2 % — ABNORMAL LOW (ref 12.0–46.0)
Lymphs Abs: 0.5 10*3/uL — ABNORMAL LOW (ref 0.7–4.0)
MCHC: 33.9 g/dL (ref 30.0–36.0)
MCV: 95.3 fl (ref 78.0–100.0)
Monocytes Absolute: 0.5 10*3/uL (ref 0.1–1.0)
Monocytes Relative: 7.9 % (ref 3.0–12.0)
Neutro Abs: 5.2 10*3/uL (ref 1.4–7.7)
Neutrophils Relative %: 77.8 % — ABNORMAL HIGH (ref 43.0–77.0)
Platelets: 249 10*3/uL (ref 150.0–400.0)
RBC: 5.21 Mil/uL (ref 4.22–5.81)
RDW: 14.3 % (ref 11.5–15.5)
WBC: 6.7 10*3/uL (ref 4.0–10.5)

## 2022-02-21 LAB — COMPREHENSIVE METABOLIC PANEL
ALT: 29 U/L (ref 0–53)
AST: 17 U/L (ref 0–37)
Albumin: 4.3 g/dL (ref 3.5–5.2)
Alkaline Phosphatase: 30 U/L — ABNORMAL LOW (ref 39–117)
BUN: 10 mg/dL (ref 6–23)
CO2: 26 mEq/L (ref 19–32)
Calcium: 9 mg/dL (ref 8.4–10.5)
Chloride: 108 mEq/L (ref 96–112)
Creatinine, Ser: 1.41 mg/dL (ref 0.40–1.50)
GFR: 53.95 mL/min — ABNORMAL LOW (ref >60.00)
Glucose, Bld: 79 mg/dL (ref 70–99)
Potassium: 4.2 mEq/L (ref 3.5–5.1)
Sodium: 143 mEq/L (ref 135–145)
Total Bilirubin: 0.8 mg/dL (ref 0.2–1.2)
Total Protein: 6.2 g/dL (ref 6.0–8.3)

## 2022-02-21 LAB — PSA, MEDICARE: PSA: 1.44 ng/ml (ref 0.10–4.00)

## 2022-02-21 LAB — LIPID PANEL
Cholesterol: 218 mg/dL — ABNORMAL HIGH (ref 0–200)
HDL: 24.2 mg/dL — ABNORMAL LOW (ref >39.00)
LDL Cholesterol: 176 mg/dL — ABNORMAL HIGH (ref 0–99)
NonHDL: 194.01
Total CHOL/HDL Ratio: 9
Triglycerides: 92 mg/dL (ref 0.0–149.0)
VLDL: 18.4 mg/dL (ref 0.0–40.0)

## 2022-02-21 LAB — TSH: TSH: 2.69 u[IU]/mL (ref 0.35–5.50)

## 2022-02-21 LAB — HEMOGLOBIN A1C: Hgb A1c MFr Bld: 5.1 % (ref 4.6–6.5)

## 2022-02-21 MED ORDER — LEVOTHYROXINE SODIUM 50 MCG PO TABS: 50.0000 ug | ORAL_TABLET | Freq: Every day | ORAL | 1 refills | Status: DC

## 2022-02-21 NOTE — Progress Notes (Signed)
Office Note 02/21/2022  CC:  Chief Complaint  Patient presents with   Establish Care   Medical Management of Chronic Issues    Thyroid; still having issue with night sweats, chills, fatigue   HPI:  Luis Whitaker is a 62 y.o. male who is here to establish care and health maintenance exam, and discuss hypothyroidism and right shoulder pain. Patient's most recent primary MD: Rowan Blase, [place] records in epic/healthlink EMR were reviewed prior to or during today's visit.  Back in February 2023 he went to the ER for some dizziness and sweating and symptoms that were not consistent with stroke. An MRI brain was abnormal, showing signs of old stroke.  He was started on statin and aspirin.  Neurologist ordered echo, which was normal.  Also ordered outpatient rhythm monitoring which patient chose to not yet. He has had no further episodes/problems related to this. He has chosen not to take statin due to concern for possible side effects.  At some point around that time he was found to have elevated TSH and was started on levothyroxine 50 mcg daily.  His symptoms resolved(night sweats, cold chills, fatigue.  However, lately sx's are re-emerging, mild intensity. Has some intermittent snoring that is almost always related to nasal congestion, no witnessed apnea.   Past Medical History:  Diagnosis Date   Acquired hypothyroidism    Asthma    Exercise-induced   CVA (cerebral vascular accident) ([place])    02/2021--abnormal MRI scan showing remote age left parietal occipital and left parietal embolic silent infarcts of cryptogenic etiology.  Echo was good.  Event monitor ordered but not done   Hyperlipidemia    Patient declines statin   Lumbar spondylosis     Past Surgical History:  Procedure Laterality Date   KNEE ARTHROSCOPY  01/16/2000   bilat   TRANSTHORACIC ECHOCARDIOGRAM     02/2021 normal except Grd I DD, also aortic root dilation 44 mm    Family History  Problem  Relation Age of Onset   Colon cancer Neg Hx    Pancreatic cancer Neg Hx    Rectal cancer Neg Hx    Stomach cancer Neg Hx     Social History   Socioeconomic History   Marital status: Married    Spouse name: Not on file   Number of children: Not on file   Years of education: Not on file   Highest education level: Not on file  Occupational History   Not on file  Tobacco Use   Smoking status: Never   Smokeless tobacco: Never  Substance and Sexual Activity   Alcohol use: No   Drug use: No   Sexual activity: Not on file  Other Topics Concern   Not on file  Social History Narrative   Married, 3 children.   Originally from [place].   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   No T/A/Ds   Lifts weights and does cardio daily.   Social Determinants of Health   Financial Resource Strain: Not on file  Food Insecurity: Not on file  Transportation Needs: Not on file  Physical Activity: Not on file  Stress: Not on file  Social Connections: Not on file  Intimate Partner Violence: Not on file    Outpatient Encounter Medications as of 02/21/2022  Medication Sig   aspirin EC 81 MG tablet Take 81 mg by mouth daily. Swallow whole.   [DISCONTINUED] levothyroxine (SYNTHROID) 50 MCG tablet Take 50 mcg by mouth daily.  albuterol (PROVENTIL HFA;VENTOLIN HFA) 108 (90 BASE) MCG/ACT inhaler Inhale 1-2 puffs into the lungs every 6 (six) hours as needed for wheezing or shortness of breath (seasonal with pollen). (Patient not taking: Reported on 02/21/2022)   levothyroxine (SYNTHROID) 50 MCG tablet Take 1 tablet (50 mcg total) by mouth daily.   [DISCONTINUED] atorvastatin (LIPITOR) 80 MG tablet Take 1 tablet (80 mg total) by mouth daily.   [DISCONTINUED] Multiple Vitamins-Minerals (CENTRUM SILVER PO) Take 1 tablet by mouth daily.   No facility-administered encounter medications on file as of 02/21/2022.    Allergies  Allergen Reactions   Penicillins Rash     Review of Systems  Constitutional:  Positive for chills and fatigue. Negative for appetite change and fever.  HENT:  Negative for congestion, dental problem, ear pain and sore throat.   Eyes:  Negative for discharge, redness and visual disturbance.  Respiratory:  Negative for cough, chest tightness, shortness of breath and wheezing.   Cardiovascular:  Negative for chest pain, palpitations and leg swelling.  Gastrointestinal:  Negative for abdominal pain, blood in stool, diarrhea, nausea and vomiting.  Genitourinary:  Negative for difficulty urinating, dysuria, flank pain, frequency, hematuria and urgency.  Musculoskeletal:  Positive for arthralgias (R shoulder). Negative for back pain, joint swelling, myalgias and neck stiffness.  Skin:  Negative for pallor and rash.  Neurological:  Negative for dizziness, speech difficulty, weakness and headaches.  Hematological:  Negative for adenopathy. Does not bruise/bleed easily.  Psychiatric/Behavioral:  Negative for confusion and sleep disturbance. The patient is not nervous/anxious.    PE; Blood pressure 135/83, pulse 70, temperature 98.1 F (36.7 C), height 6\' 2"  (1.88 m), weight 242 lb 9.6 oz (110 kg), SpO2 95 %.Body mass index is 31.15 kg/m.  Physical Exam  Gen: Alert, well appearing.  Patient is oriented to person, place, time, and situation. AFFECT: pleasant, lucid thought and speech. ENT: Ears: EACs clear, normal epithelium.  TMs with good light reflex and landmarks bilaterally.  Eyes: no injection, icteris, swelling, or exudate.  EOMI, PERRLA. Nose: no drainage or turbinate edema/swelling.  No injection or focal lesion.  Mouth: lips without lesion/swelling.  Oral mucosa pink and moist.  Dentition intact and without obvious caries or gingival swelling.  Oropharynx without erythema, exudate, or swelling.  Neck: supple/nontender.  No LAD, mass, or TM.  Carotid pulses 2+ bilaterally, without bruits. CV: RRR, no m/r/g.   LUNGS: CTA bilat,  nonlabored resps, good aeration in all lung fields. ABD: soft, NT, ND, BS normal.  No hepatospenomegaly or mass.  No bruits. EXT: no clubbing, cyanosis, or edema.  Musculoskeletal: Right shoulder with mild tenderness to palpation around the acromion and over long head of biceps tendon.  Positive Hawkins and Neer's, positive speeds.  Positive O'Brien's.  Painful abduction in the 30 degree to 150 degree arc, negative drop sign.  Question of some subluxation with popping on apprehension testing.  Instability testing of questionable validity given patient's very muscle-bound body habitus.  Otherwise, no joint swelling, erythema, warmth, or tenderness.  ROM of all joints intact. Skin - no sores or suspicious lesions or rashes or color changes  Pertinent labs:  Last CBC Lab Results  Component Value Date   WBC 9.5 03/02/2021   HGB 16.6 03/02/2021   HCT 52.4 (H) 03/02/2021   MCV 92.3 03/02/2021   MCH 29.2 03/02/2021   RDW 14.1 03/02/2021   PLT 280 32/20/2542   Last metabolic panel Lab Results  Component Value Date   GLUCOSE 98 03/02/2021  NA 144 03/02/2021   K 4.3 03/02/2021   CL 106 03/02/2021   CO2 30 03/02/2021   BUN 11 03/02/2021   CREATININE 1.54 (H) 03/02/2021   GFRNONAA 51 (L) 03/02/2021   CALCIUM 8.6 (L) 03/02/2021   PROT 6.3 (L) 03/02/2021   ALBUMIN 4.0 03/02/2021   BILITOT 0.8 03/02/2021   ALKPHOS 31 (L) 03/02/2021   AST 20 03/02/2021   ALT 23 03/02/2021   ANIONGAP 8 03/02/2021   Last lipids Lab Results  Component Value Date   CHOL 189 03/09/2021   HDL 24 (L) 03/09/2021   LDLCALC 148 (H) 03/09/2021   TRIG 94 03/09/2021   CHOLHDL 7.9 (H) 03/09/2021   Last hemoglobin A1c Lab Results  Component Value Date   HGBA1C 5.4 03/09/2021   Last thyroid functions Lab Results  Component Value Date   TSH 4.050 03/02/2021   ASSESSMENT AND PLAN:   New patient, establishing care.  #1 health maintenance exam: Reviewed age and gender appropriate health maintenance  issues (prudent diet, regular exercise, health risks of tobacco and excessive alcohol, use of seatbelts, fire alarms in home, use of sunscreen).  Also reviewed age and gender appropriate health screening as well as vaccine recommendations. Vaccines: Patient declines Tdap and Shingrix for now. Labs: Nonfasting health panel, PSA Prostate ca screening: PSA today Colon ca screening: Patient declines colon cancer screening at this time.  #2 hypothyroidism. I do not know what his TSH was at the time of diagnosis. Will get all labs. His symptoms went away with 50 mcg daily levothyroxine. However, over the last couple months his symptoms have gradually begin returning and mild intensity. Check TSH today.  #3 right shoulder pain, chronic and progressive. History of traumatic injury skiing. Exam suggestive of rotator cuff impingement, biceps tendinitis/tendinopathy, as well as possible labral injury. Question shoulder instability (? Dislocation at the time of acute injury?). Check right shoulder radiographs. Refer to physical therapy. Decided against steroid injection today.  Will consider this if not improved in 6 to 8 weeks. Additional consideration would be MRI if not improving. (Bedside MS[facility] ultrasound today: Anechoic fluid distending long head of biceps tendon sheath, bicep tendon thickening noted, no tear.  AC joint mild capsular distention and degenerative changes.  No rotator cuff impingement noted on dynamic imaging.  Diffuse rotator cuff tendon thickening with some hypoechoic changes, no definite tear.  Some thickening of the subacromial/subdeltoid bursal fat but no significant bursal fluid. Posterior view of glenohumeral joint showed no effusion, no obvious labral injury).  An After Visit Summary was printed and given to the patient.  Return for 6-8 week f/u R shoulder.  Signed:  Crissie Sickles, MD           02/21/2022

## 2022-02-23 ENCOUNTER — Ambulatory Visit
Admission: RE | Admit: 2022-02-23 | Discharge: 2022-02-23 | Disposition: A | Discharge status: Home / Self Care | Payer: 59 | Source: Ambulatory Visit | Attending: Family Medicine | Admitting: Family Medicine

## 2022-02-23 DIAGNOSIS — M67921 Unspecified disorder of synovium and tendon, right upper arm: Secondary | ICD-10-CM

## 2022-02-23 DIAGNOSIS — S46001A Unspecified injury of muscle(s) and tendon(s) of the rotator cuff of right shoulder, initial encounter: Secondary | ICD-10-CM

## 2022-02-23 DIAGNOSIS — M19011 Primary osteoarthritis, right shoulder: Secondary | ICD-10-CM | POA: Diagnosis not present

## 2022-02-23 DIAGNOSIS — G8929 Other chronic pain: Secondary | ICD-10-CM

## 2022-02-23 DIAGNOSIS — E039 Hypothyroidism, unspecified: Secondary | ICD-10-CM

## 2022-02-23 DIAGNOSIS — M67911 Unspecified disorder of synovium and tendon, right shoulder: Secondary | ICD-10-CM

## 2022-03-01 DIAGNOSIS — M25511 Pain in right shoulder: Secondary | ICD-10-CM | POA: Diagnosis not present

## 2022-03-05 DIAGNOSIS — M25511 Pain in right shoulder: Secondary | ICD-10-CM | POA: Diagnosis not present

## 2022-03-07 DIAGNOSIS — M25511 Pain in right shoulder: Secondary | ICD-10-CM | POA: Diagnosis not present

## 2022-03-12 DIAGNOSIS — M25511 Pain in right shoulder: Secondary | ICD-10-CM | POA: Diagnosis not present

## 2022-03-20 DIAGNOSIS — M25511 Pain in right shoulder: Secondary | ICD-10-CM | POA: Diagnosis not present

## 2022-03-22 DIAGNOSIS — M25511 Pain in right shoulder: Secondary | ICD-10-CM | POA: Diagnosis not present

## 2022-03-26 DIAGNOSIS — M25511 Pain in right shoulder: Secondary | ICD-10-CM | POA: Diagnosis not present

## 2022-04-12 ENCOUNTER — Encounter: Payer: Self-pay | Admitting: Family Medicine

## 2022-04-12 ENCOUNTER — Ambulatory Visit (INDEPENDENT_AMBULATORY_CARE_PROVIDER_SITE_OTHER): Payer: 59 | Admitting: Family Medicine

## 2022-04-12 VITALS — BP 133/72 | HR 59 | Temp 98.8°F | Ht 74.0 in | Wt 240.2 lb

## 2022-04-12 DIAGNOSIS — M67911 Unspecified disorder of synovium and tendon, right shoulder: Secondary | ICD-10-CM

## 2022-04-12 DIAGNOSIS — M7541 Impingement syndrome of right shoulder: Secondary | ICD-10-CM | POA: Diagnosis not present

## 2022-04-12 DIAGNOSIS — M25511 Pain in right shoulder: Secondary | ICD-10-CM

## 2022-04-12 DIAGNOSIS — S46001A Unspecified injury of muscle(s) and tendon(s) of the rotator cuff of right shoulder, initial encounter: Secondary | ICD-10-CM | POA: Diagnosis not present

## 2022-04-12 MED ORDER — TRIAMCINOLONE ACETONIDE 40 MG/ML IJ SUSP
40.0000 mg | Freq: Once | INTRAMUSCULAR | Status: AC
  Administered 2022-04-12: 40 mg via INTRA_ARTICULAR

## 2022-04-12 NOTE — Progress Notes (Signed)
OFFICE VISIT  04/12/2022  CC:  Chief Complaint  Patient presents with   Follow-up    Shoulder pain (R)    Patient is a 62 y.o. male who presents for 6-week follow-up chronic right shoulder pain. A/P as of last visit: " Right shoulder pain, chronic and progressive. History of traumatic injury skiing. Exam suggestive of rotator cuff impingement, biceps tendinitis/tendinopathy, as well as possible labral injury. Question shoulder instability (? Dislocation at the time of acute injury?). Check right shoulder radiographs. Refer to physical therapy. Decided against steroid injection today.  Will consider this if not improved in 6 to 8 weeks.  INTERIM HX: Right shoulder radiographs show glenohumeral degenerative changes. PT has been helping some.  He got some dry needling with this.  He has been doing his home exercises. Still hurts to lay on it.  Still hurting remarkably when AB ducting  Past Medical History:  Diagnosis Date   Acquired hypothyroidism    Asthma    Exercise-induced   CVA (cerebral vascular accident) ([place])    02/2021--abnormal MRI scan showing remote age left parietal occipital and left parietal embolic silent infarcts of cryptogenic etiology.  Echo was good.  Event monitor ordered but not done   Hyperlipidemia    Patient declines statin   Lumbar spondylosis     Past Surgical History:  Procedure Laterality Date   KNEE ARTHROSCOPY  01/16/2000   bilat   TRANSTHORACIC ECHOCARDIOGRAM     02/2021 normal except Grd I DD, also aortic root dilation 44 mm    Outpatient Medications Prior to Visit  Medication Sig Dispense Refill   albuterol (PROVENTIL HFA;VENTOLIN HFA) 108 (90 BASE) MCG/ACT inhaler Inhale 1-2 puffs into the lungs every 6 (six) hours as needed for wheezing or shortness of breath (seasonal with pollen).     aspirin EC 81 MG tablet Take 81 mg by mouth daily. Swallow whole.     levothyroxine (SYNTHROID) 50 MCG tablet Take 1 tablet (50 mcg total) by mouth  daily. 90 tablet 1   No facility-administered medications prior to visit.    Allergies  Allergen Reactions   Penicillins Rash    Review of Systems As per HPI  PE:    04/12/2022    8:04 AM 02/21/2022   10:02 AM 03/09/2021    7:49 AM  Vitals with BMI  Height 6\' 2"  6\' 2"  6\' 2"   Weight 240 lbs 3 oz 242 lbs 10 oz 232 lbs  BMI 30.83 [id number]  Systolic [id number] [id number]  Diastolic 72 83 87  Pulse 59 70      Physical Exam  General: Alert and well-appearing. Right shoulder with positive speeds, positive Hawkins, equivocal Neer's.  O'Brien's negative.  Significant pain with abduction up to 90 degrees where he is then unable to pass.  At 90 degrees of abduction is very difficult to externally rotate due to pain.  LABS:  Last CBC Lab Results  Component Value Date   WBC 6.7 02/21/2022   HGB 16.8 02/21/2022   HCT 49.7 02/21/2022   MCV 95.3 02/21/2022   MCH 29.2 03/02/2021   RDW 14.3 02/21/2022   PLT 249.0 [id number]   Last metabolic panel Lab Results  Component Value Date   GLUCOSE 79 02/21/2022   NA 143 02/21/2022   K 4.2 02/21/2022   CL 108 02/21/2022   CO2 26 02/21/2022   BUN 10 02/21/2022   CREATININE 1.41 02/21/2022   GFRNONAA 51 (L) 03/02/2021   CALCIUM 9.0 02/21/2022   PROT  6.2 02/21/2022   ALBUMIN 4.3 02/21/2022   BILITOT 0.8 02/21/2022   ALKPHOS 30 (L) 02/21/2022   AST 17 02/21/2022   ALT 29 02/21/2022   ANIONGAP 8 03/02/2021   Last lipids Lab Results  Component Value Date   CHOL 218 (H) 02/21/2022   HDL 24.20 (L) 02/21/2022   LDLCALC 176 (H) 02/21/2022   TRIG 92.0 02/21/2022   CHOLHDL 9 02/21/2022   Last hemoglobin A1c Lab Results  Component Value Date   HGBA1C 5.1 02/21/2022   Last thyroid functions Lab Results  Component Value Date   TSH 2.69 02/21/2022   IMPRESSION AND PLAN:  Subacute pain of right shoulder. Subacromial impingement.  He also has some glenohumeral arthritis on x-ray. He is improving some with PT but still prominent pain  and inability to do certain weight lifting that he really likes to do. Discussed steroid injection today and he wanted to proceed with this. No advanced imaging at this time.  Ultrasound-guided injection is preferred based on studies that show increased duration, increased effect, greater accuracy, decreased procedural pain, increased response rate, and decreased cost with ultrasound-guided versus blind injection. Procedure: Real-time ultrasound guided injection of Right subacrom/subdelt bursa. Device: [employer] informed consent obtained.  Timeout conducted.  No overlying erythema, induration, or other signs of local infection. After sterile prep with Betadine, injected 3 cc of 2% lidocaine without epinephrine followed by mix of 3cc 2% lidocaine + 40mg  kenalog.  Injectate seen filling subacrom bursa. Patient tolerated the procedure well.  No immediate complications.  Post-injection care discussed. Advised to call if fever/chills, erythema, drainage, or persistent bleeding.  Impression: Technically successful ultrasound-guided injection.  An After Visit Summary was printed and given to the patient.  FOLLOW UP: No follow-ups on file.  Signed:  Crissie Sickles, MD           04/12/2022

## 2022-07-02 ENCOUNTER — Encounter: Payer: Self-pay | Admitting: Family Medicine

## 2022-07-02 ENCOUNTER — Ambulatory Visit (INDEPENDENT_AMBULATORY_CARE_PROVIDER_SITE_OTHER): Payer: 59 | Admitting: Family Medicine

## 2022-07-02 VITALS — BP 144/87 | HR 71 | Wt 235.0 lb

## 2022-07-02 DIAGNOSIS — G8929 Other chronic pain: Secondary | ICD-10-CM

## 2022-07-02 DIAGNOSIS — K429 Umbilical hernia without obstruction or gangrene: Secondary | ICD-10-CM | POA: Diagnosis not present

## 2022-07-02 DIAGNOSIS — M25511 Pain in right shoulder: Secondary | ICD-10-CM | POA: Diagnosis not present

## 2022-07-02 DIAGNOSIS — M19011 Primary osteoarthritis, right shoulder: Secondary | ICD-10-CM | POA: Diagnosis not present

## 2022-07-02 DIAGNOSIS — M7541 Impingement syndrome of right shoulder: Secondary | ICD-10-CM

## 2022-07-02 NOTE — Progress Notes (Signed)
OFFICE VISIT  07/02/2022  CC:  Chief Complaint  Patient presents with   Shoulder Pain    Pt also has belly button hernia he wants to ask about.     Patient is a 62 y.o. male who presents for 19-month follow-up right shoulder pain. A/P as of last visit: "Subacute pain of right shoulder. Subacromial impingement.  He also has some glenohumeral arthritis on x-ray. He is improving some with PT but still prominent pain and inability to do certain weight lifting that he really likes to do. Discussed steroid injection today and he wanted to proceed with this. No advanced imaging at this time."  INTERIM HX: His shoulder pain improved significantly and lasted about a month.  The pain then returned to the same level and is bothering him significantly. He hears a popping with range of motion and increased pain with reaching out and up.  Most the pain is in the anterior aspect of the shoulder.  Has had an umbilical hernia for about 10 years.  In the last year or so he notes that it is much more sensitive to touch.  When it is pressed inward it elicits pain in the area as well as radiating down into the suprapubic region.   Past Medical History:  Diagnosis Date   Acquired hypothyroidism    Asthma    Exercise-induced   Chronic renal insufficiency, stage 3 (moderate) ([facility])    CVA (cerebral vascular accident) ([facility])    02/2021--abnormal MRI scan showing remote age left parietal occipital and left parietal embolic silent infarcts of cryptogenic etiology.  Echo was good.  Event monitor ordered but not done   Hyperlipidemia    Patient declines statin   Lumbar spondylosis     Past Surgical History:  Procedure Laterality Date   KNEE ARTHROSCOPY  01/16/2000   bilat   TRANSTHORACIC ECHOCARDIOGRAM     02/2021 normal except Grd I DD, also aortic root dilation 44 mm    Outpatient Medications Prior to Visit  Medication Sig Dispense Refill   albuterol (PROVENTIL HFA;VENTOLIN HFA) 108 (90 BASE) MCG/ACT  inhaler Inhale 1-2 puffs into the lungs every 6 (six) hours as needed for wheezing or shortness of breath (seasonal with pollen).     aspirin EC 81 MG tablet Take 81 mg by mouth daily. Swallow whole.     levothyroxine (SYNTHROID) 50 MCG tablet Take 1 tablet (50 mcg total) by mouth daily. 90 tablet 1   No facility-administered medications prior to visit.    Allergies  Allergen Reactions   Penicillins Rash    Review of Systems As per HPI  PE:    07/02/2022    2:10 PM 04/12/2022    8:04 AM 02/21/2022   10:02 AM  Vitals with BMI  Height  6\' 2"  6\' 2"   Weight 235 lbs 240 lbs 3 oz 242 lbs 10 oz  BMI  30.83 31.13  Systolic 144 133 409  Diastolic 87 72 83  Pulse 71 59 70    Physical Exam  Gen: Alert, well appearing.  Patient is oriented to person, place, time, and situation. AFFECT: pleasant, lucid thought and speech. Abdomen: Approximately 2 to 3 cm umbilical hernia palpable, moderate sensitivity to palpation.  Reducible. Right shoulder has some limitation of range of motion in abduction as well as external rotation.  Crepitus noted.  Tenderness to palpation over the long head of the biceps. Positive Hawkins and O'Brien's.  Positive speeds.   LABS:  Last metabolic panel Lab Results  Component Value Date   GLUCOSE 79 02/21/2022   NA 143 02/21/2022   K 4.2 02/21/2022   CL 108 02/21/2022   CO2 26 02/21/2022   BUN 10 02/21/2022   CREATININE 1.41 02/21/2022   GFRNONAA 51 (L) 03/02/2021   CALCIUM 9.0 02/21/2022   PROT 6.2 02/21/2022   ALBUMIN 4.3 02/21/2022   BILITOT 0.8 02/21/2022   ALKPHOS 30 (L) 02/21/2022   AST 17 02/21/2022   ALT 29 02/21/2022   ANIONGAP 8 03/02/2021     Lab Results  Component Value Date   TSH 2.69 02/21/2022    IMPRESSION AND PLAN:  #1 right shoulder pain for approximately 18 months now. Exam consistent with rotator cuff impingement as well as biceps tendinopathy. 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 Began with traumatic injury while skiing. He described possible brief dislocation at the time. I am concerned about labral injury. Will refer to orthopedics.  #2 umbilical hernia.  Causing some pain/discomfort now. Will refer to general surgery.  #3 night sweats. Greater than 1 year now.  No abnormal weight loss or other red flag symptoms at this time. General lab panel, including TSH, normal 4 months ago. No further workup at this time.  Will reassess this in 3 months.  An After Visit Summary was printed and given to the patient.  FOLLOW UP: Return in about 3 months (around 10/02/2022) for f/u night sweats.  Signed:  Santiago Bumpers, MD           07/02/2022

## 2022-07-06 DIAGNOSIS — M25511 Pain in right shoulder: Secondary | ICD-10-CM | POA: Diagnosis not present

## 2022-07-20 DIAGNOSIS — K429 Umbilical hernia without obstruction or gangrene: Secondary | ICD-10-CM | POA: Diagnosis not present

## 2022-08-27 ENCOUNTER — Other Ambulatory Visit: Payer: Self-pay | Admitting: Family Medicine

## 2022-08-30 ENCOUNTER — Encounter (INDEPENDENT_AMBULATORY_CARE_PROVIDER_SITE_OTHER): Payer: Self-pay

## 2022-08-31 ENCOUNTER — Other Ambulatory Visit: Payer: Self-pay | Admitting: Surgery

## 2022-08-31 DIAGNOSIS — K420 Umbilical hernia with obstruction, without gangrene: Secondary | ICD-10-CM | POA: Diagnosis not present

## 2022-09-26 ENCOUNTER — Other Ambulatory Visit: Payer: Self-pay | Admitting: Family Medicine

## 2022-10-03 ENCOUNTER — Ambulatory Visit: Payer: 59 | Admitting: Family Medicine

## 2022-10-23 DIAGNOSIS — K429 Umbilical hernia without obstruction or gangrene: Secondary | ICD-10-CM | POA: Diagnosis not present

## 2022-10-25 ENCOUNTER — Other Ambulatory Visit: Payer: Self-pay | Admitting: Family Medicine

## 2022-10-25 ENCOUNTER — Telehealth: Payer: Self-pay | Admitting: Family Medicine

## 2022-10-25 MED ORDER — LEVOTHYROXINE SODIUM 50 MCG PO TABS: 50.0000 ug | ORAL_TABLET | Freq: Every day | ORAL | 0 refills | Status: DC

## 2022-10-25 NOTE — Telephone Encounter (Signed)
Pt should have enough until next provider appt 10/14

## 2022-10-25 NOTE — Telephone Encounter (Signed)
Prescription Request  10/25/2022  LOV: 07/02/2022  What is the name of the medication or equipment? levothyroxine (SYNTHROID) 50 MCG tablet   Have you contacted your pharmacy to request a refill? Yes   Which pharmacy would you like this sent to?  [id number] - [place], [place] - [address] [place] [address] Phone: [phone] Fax: [phone]    Patient notified that their request is being sent to the clinical staff for review and that they should receive a response within 2 business days.   Please advise at Mobile [phone] (mobile)

## 2022-10-29 ENCOUNTER — Encounter: Payer: Self-pay | Admitting: Family Medicine

## 2022-10-29 ENCOUNTER — Ambulatory Visit (INDEPENDENT_AMBULATORY_CARE_PROVIDER_SITE_OTHER): Payer: 59 | Admitting: Family Medicine

## 2022-10-29 VITALS — BP 106/66 | HR 57 | Wt 224.0 lb

## 2022-10-29 DIAGNOSIS — G8929 Other chronic pain: Secondary | ICD-10-CM | POA: Diagnosis not present

## 2022-10-29 DIAGNOSIS — M19011 Primary osteoarthritis, right shoulder: Secondary | ICD-10-CM | POA: Diagnosis not present

## 2022-10-29 DIAGNOSIS — M25511 Pain in right shoulder: Secondary | ICD-10-CM | POA: Diagnosis not present

## 2022-10-29 DIAGNOSIS — R61 Generalized hyperhidrosis: Secondary | ICD-10-CM

## 2022-10-29 DIAGNOSIS — M7541 Impingement syndrome of right shoulder: Secondary | ICD-10-CM | POA: Diagnosis not present

## 2022-10-29 MED ORDER — TRIAMCINOLONE ACETONIDE 40 MG/ML IJ SUSP
40.0000 mg | Freq: Once | INTRAMUSCULAR | Status: AC
Start: 2022-10-29 — End: 2022-10-29
  Administered 2022-10-29: 40 mg via INTRAMUSCULAR

## 2022-10-29 NOTE — Progress Notes (Signed)
OFFICE VISIT  10/29/2022  CC:  Chief Complaint  Patient presents with   Night Sweats    F/U. Pt states he has not had any except one within the last 2 weeks;     Patient is a 62 y.o. male who presents for 17-month follow-up night sweats. A/P as of last visit: " night sweats. Greater than 1 year now.  No abnormal weight loss or other red flag symptoms at this time. General lab panel, including TSH, normal 4 months ago. No further workup at this time.  Will reassess this in 3 months."  INTERIM HX: Still with night sweats several days per week.  Says he starts shaking and will feel cold and then he will eventually break out in a sweat.  Temperatures measured during this time are normal. No bodyaches, headaches, abnormal weight loss, appetite loss, or cough. No known trigger.  Denies much anticipatory anxiety problems.  Chronic right shoulder pain.  Has had radiographs confirming some osteoarthritis.  His exam in the past has shown signs of post osteoarthritic pain as well as subacromial impingement. Injections have been helpful in the past.  Orthopedist saw him and said steroid injection every 3 months could be done and eventually may need surgery. Pain worse the last several weeks.  Hurts him at night.  Hurts him to abduct and externally rotate.  No neck pain or radiation down the arm.  No arm paresthesias  ROS as above, plus--> no CP, no SOB, no wheezing, no dizziness, no rashes, no melena/hematochezia.  No polyuria or polydipsia.  No myalgias.  No joint pain other than right shoulder.  No focal weakness, paresthesias, or tremors.  No acute vision or hearing abnormalities.  No dysuria or unusual/new urinary urgency or frequency.  No recent changes in lower legs. No n/v/d or abd pain.  No palpitations.      Past Medical History:  Diagnosis Date   Acquired hypothyroidism    Asthma    Exercise-induced   Chronic renal insufficiency, stage 3 (moderate) (HCC)    CVA (cerebral vascular  accident) (HCC)    02/2021--abnormal MRI scan showing remote age left parietal occipital and left parietal embolic silent infarcts of cryptogenic etiology.  Echo was good.  Event monitor ordered but not done   Hyperlipidemia    Patient declines statin   Lumbar spondylosis     Past Surgical History:  Procedure Laterality Date   KNEE ARTHROSCOPY  01/16/2000   bilat   TRANSTHORACIC ECHOCARDIOGRAM     02/2021 normal except Grd I DD, also aortic root dilation 44 mm    Outpatient Medications Prior to Visit  Medication Sig Dispense Refill   albuterol (PROVENTIL HFA;VENTOLIN HFA) 108 (90 BASE) MCG/ACT inhaler Inhale 1-2 puffs into the lungs every 6 (six) hours as needed for wheezing or shortness of breath (seasonal with pollen).     aspirin EC 81 MG tablet Take 81 mg by mouth daily. Swallow whole.     levothyroxine (SYNTHROID) 50 MCG tablet Take 1 tablet (50 mcg total) by mouth daily. 30 tablet 0   oxyCODONE (OXY IR/ROXICODONE) 5 MG immediate release tablet Take 5 mg by mouth every 6 (six) hours as needed for moderate pain or severe pain. (Patient not taking: Reported on 10/29/2022)     No facility-administered medications prior to visit.    Allergies  Allergen Reactions   Penicillins Rash    Review of Systems As per HPI  PE:    10/29/2022    8:21 AM 10/29/2022  8:19 AM 07/02/2022    2:10 PM  Vitals with BMI  Weight  224 lbs 235 lbs  Systolic 106 142 562  Diastolic 66 85 87  Pulse  57 71     Physical Exam  Gen: Alert, well appearing.  Patient is oriented to person, place, time, and situation. AFFECT: pleasant, lucid thought and speech. Right shoulder with minimal discomfort with palpation around the acromion.  Significant limitation of range of motion--> abduction to about 80 degrees, external rotation with arm abducted is about 45 degrees, internal rotation is full. Hawkins positive, O'Brien's positive.  LABS:  Last CBC Lab Results  Component Value Date   WBC 6.7  02/21/2022   HGB 16.8 02/21/2022   HCT 49.7 02/21/2022   MCV 95.3 02/21/2022   MCH 29.2 03/02/2021   RDW 14.3 02/21/2022   PLT 249.0 02/21/2022   Last metabolic panel Lab Results  Component Value Date   GLUCOSE 79 02/21/2022   NA 143 02/21/2022   K 4.2 02/21/2022   CL 108 02/21/2022   CO2 26 02/21/2022   BUN 10 02/21/2022   CREATININE 1.41 02/21/2022   GFR 53.95 (L) 02/21/2022   CALCIUM 9.0 02/21/2022   PROT 6.2 02/21/2022   ALBUMIN 4.3 02/21/2022   BILITOT 0.8 02/21/2022   ALKPHOS 30 (L) 02/21/2022   AST 17 02/21/2022   ALT 29 02/21/2022   ANIONGAP 8 03/02/2021   Last lipids Lab Results  Component Value Date   CHOL 218 (H) 02/21/2022   HDL 24.20 (L) 02/21/2022   LDLCALC 176 (H) 02/21/2022   TRIG 92.0 02/21/2022   CHOLHDL 9 02/21/2022   Last hemoglobin A1c Lab Results  Component Value Date   HGBA1C 5.1 02/21/2022   Last thyroid functions Lab Results  Component Value Date   TSH 2.69 02/21/2022   Lab Results  Component Value Date   PSA 1.44 02/21/2022   IMPRESSION AND PLAN:  #1 night sweats. Greater than 15 months now.  No abnormal weight loss or other red flag symptoms at this time (has dropped a few lbs since having umb herniorrhaphy about 1 wk ago). General lab panel, including TSH, normal 7 months ago. No further workup at this time.    #2 chronic right shoulder pain.  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 Patient's most recent steroid injection with his orthopedist was greater than 3 months ago. He has gotten some mild to moderate improvement lasting anywhere from 2 to 3 weeks up to 2 months with injections in the past--> patient is in favor of injection today.   Glenohumeral joint injection performed today.  Ultrasound-guided injection is preferred based on studies that show increased duration, increased effect, greater accuracy, decreased procedural pain, increased response rate, and decreased cost with  ultrasound-guided versus blind injection. Procedure: Real-time ultrasound guided injection of right shoulder glenohumeral joint. Device: [employer] informed consent obtained.  Timeout conducted.  No overlying erythema, induration, or other signs of local infection. After sterile prep with Betadine, injected 3 mL of 1% plain lidocaine for anesthesia.  This was followed by 40 mg Kenalog mixed with 3 mL of 1% plain lidocaine.  Injectate seen filling joint capsule. Patient tolerated the procedure well.  No immediate complications.  Post-injection care discussed. Advised to call if fever/chills, erythema, drainage, or persistent bleeding.  Impression: Technically successful ultrasound-guided injection.  An After Visit Summary was printed and given to the patient.  FOLLOW UP: Return in about 4 months (around 03/01/2023) for annual CPE (  fasting).  Signed:  Santiago Bumpers, MD           10/29/2022

## 2022-11-19 DIAGNOSIS — Z09 Encounter for follow-up examination after completed treatment for conditions other than malignant neoplasm: Secondary | ICD-10-CM | POA: Diagnosis not present

## 2022-11-19 DIAGNOSIS — K420 Umbilical hernia with obstruction, without gangrene: Secondary | ICD-10-CM | POA: Diagnosis not present

## 2022-11-22 ENCOUNTER — Other Ambulatory Visit: Payer: Self-pay | Admitting: Family Medicine

## 2022-12-22 ENCOUNTER — Other Ambulatory Visit: Payer: Self-pay | Admitting: Family Medicine

## 2022-12-24 NOTE — Telephone Encounter (Signed)
Prescription Request  12/24/2022  LOV: 10/29/2022  What is the name of the medication or equipment?  Patient will be out of medication only has enough for today   levothyroxine (SYNTHROID) 50 MCG tablet    Have you contacted your pharmacy to request a refill? Yes   Which pharmacy would you like this sent to?  [id number] - [place], [place] - [address] [place] [address] Phone: [phone] Fax: [phone]    Patient notified that their request is being sent to the clinical staff for review and that they should receive a response within 2 business days.   Please advise at Mobile [phone] (mobile)

## 2023-01-28 ENCOUNTER — Encounter: Payer: Self-pay | Admitting: Family Medicine

## 2023-01-28 ENCOUNTER — Ambulatory Visit: Payer: 59 | Admitting: Family Medicine

## 2023-01-28 VITALS — BP 132/87 | HR 65 | Wt 232.2 lb

## 2023-01-28 DIAGNOSIS — G8929 Other chronic pain: Secondary | ICD-10-CM | POA: Diagnosis not present

## 2023-01-28 DIAGNOSIS — M25511 Pain in right shoulder: Secondary | ICD-10-CM | POA: Diagnosis not present

## 2023-01-28 DIAGNOSIS — E039 Hypothyroidism, unspecified: Secondary | ICD-10-CM | POA: Diagnosis not present

## 2023-01-28 DIAGNOSIS — R7989 Other specified abnormal findings of blood chemistry: Secondary | ICD-10-CM

## 2023-01-28 DIAGNOSIS — R6882 Decreased libido: Secondary | ICD-10-CM

## 2023-01-28 DIAGNOSIS — M19011 Primary osteoarthritis, right shoulder: Secondary | ICD-10-CM

## 2023-01-28 DIAGNOSIS — R5382 Chronic fatigue, unspecified: Secondary | ICD-10-CM

## 2023-01-28 MED ORDER — TRIAMCINOLONE ACETONIDE 40 MG/ML IJ SUSP
40.0000 mg | Freq: Once | INTRAMUSCULAR | Status: AC
  Administered 2023-01-28: 40 mg via INTRAMUSCULAR

## 2023-01-28 NOTE — Addendum Note (Signed)
 Addended by: Daron Offer A on: 01/28/2023 02:07 PM   Modules accepted: Orders

## 2023-01-28 NOTE — Progress Notes (Signed)
 OFFICE VISIT  01/28/2023  CC:  Chief Complaint  Patient presents with   Medical Management of Chronic Issues    Pt is requesting shoulder injection.     Patient is a 63 y.o. male who presents for 102-month follow-up night sweats and chronic right shoulder pain. A/P as of last visit: #1 night sweats. Greater than 15 months now.  No abnormal weight loss or other red flag symptoms at this time (has dropped a few lbs since having umb herniorrhaphy about 1 wk ago). General lab panel, including TSH, normal 7 months ago. No further workup at this time.     #2 chronic right shoulder pain.  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 Patient's most recent steroid injection with his orthopedist was greater than 3 months ago. He has gotten some mild to moderate improvement lasting anywhere from 2 to 3 weeks up to 2 months with injections in the past--> patient is in favor of injection today.   Glenohumeral joint injection performed today.  INTERIM HX: Chronic fatigue persists.  Excessive daytime sleepiness, absolutely no libido, poor motivation.  Denies depressed mood.  His appetite is significantly decreased.  He tries to eat well and he does exercise every day.  He has lost 8 pounds over the last 3 months. He says his night sweats only occasionally occur now, the last 1 being about a month ago. He got some outside labs and brings results in today: DHEA-S 14.9, estradiol 10.3, free T32.7, PSA 1.19, and testosterone  84.6.  His shoulder injection worked very well for about 10 weeks.  In the last 7 to 10 days he has noticed significant return of symptoms and he is in favor of another injection today.  ROS as above, plus--> no fevers, no CP, no SOB, no wheezing, no cough, no dizziness, no HAs, no rashes, no melena/hematochezia.  No polyuria or polydipsia.  No myalgias. No focal weakness, paresthesias, or tremors.  No acute vision or hearing abnormalities.  No dysuria or  unusual/new urinary urgency or frequency.  No recent changes in lower legs. No n/v/d or abd pain.  No palpitations.    Past Medical History:  Diagnosis Date   Acquired hypothyroidism    Asthma    Exercise-induced   Chronic renal insufficiency, stage 3 (moderate) (HCC)    CVA (cerebral vascular accident) (HCC)    02/2021--abnormal MRI scan showing remote age left parietal occipital and left parietal embolic silent infarcts of cryptogenic etiology.  Echo was good.  Event monitor ordered but not done   Hyperlipidemia    Patient declines statin   Lumbar spondylosis     Past Surgical History:  Procedure Laterality Date   KNEE ARTHROSCOPY  01/16/2000   bilat   TRANSTHORACIC ECHOCARDIOGRAM     02/2021 normal except Grd I DD, also aortic root dilation 44 mm    Outpatient Medications Prior to Visit  Medication Sig Dispense Refill   albuterol (PROVENTIL HFA;VENTOLIN HFA) 108 (90 BASE) MCG/ACT inhaler Inhale 1-2 puffs into the lungs every 6 (six) hours as needed for wheezing or shortness of breath (seasonal with pollen).     aspirin EC 81 MG tablet Take 81 mg by mouth daily. Swallow whole.     levothyroxine  (SYNTHROID ) 50 MCG tablet TAKE 1 TABLET BY MOUTH EVERY DAY 30 tablet 2   No facility-administered medications prior to visit.    Allergies  Allergen Reactions   Penicillins Rash    Review of Systems As per HPI  PE:  01/28/2023    1:05 PM 10/29/2022    8:21 AM 10/29/2022    8:19 AM  Vitals with BMI  Weight 232 lbs 3 oz  224 lbs  Systolic 132 106 857  Diastolic 87 66 85  Pulse 65  57    Physical Exam  Gen: Alert, well appearing.  Patient is oriented to person, place, time, and situation. AFFECT: pleasant, lucid thought and speech. Right shoulder: No significant tenderness to palpation.  He has mild limitation in abduction and forward flexion due to pain and stiffness. Resisted external rotation is a bit painful.  Negative drop sign.   LABS:  Last CBC Lab Results   Component Value Date   WBC 6.7 02/21/2022   HGB 16.8 02/21/2022   HCT 49.7 02/21/2022   MCV 95.3 02/21/2022   MCH 29.2 03/02/2021   RDW 14.3 02/21/2022   PLT 249.0 02/21/2022   Last metabolic panel Lab Results  Component Value Date   GLUCOSE 79 02/21/2022   NA 143 02/21/2022   K 4.2 02/21/2022   CL 108 02/21/2022   CO2 26 02/21/2022   BUN 10 02/21/2022   CREATININE 1.41 02/21/2022   GFR 53.95 (L) 02/21/2022   CALCIUM  9.0 02/21/2022   PROT 6.2 02/21/2022   ALBUMIN 4.3 02/21/2022   BILITOT 0.8 02/21/2022   ALKPHOS 30 (L) 02/21/2022   AST 17 02/21/2022   ALT 29 02/21/2022   ANIONGAP 8 03/02/2021   Last thyroid  functions Lab Results  Component Value Date   TSH 2.69 02/21/2022   Lab Results  Component Value Date   CHOL 218 (H) 02/21/2022   HDL 24.20 (L) 02/21/2022   LDLCALC 176 (H) 02/21/2022   TRIG 92.0 02/21/2022   CHOLHDL 9 02/21/2022   IMPRESSION AND PLAN:  #1 chronic fatigue, absent libido. Recent outside lab testing showed low free T3, low DHEA-S, and low testosterone . Will repeat these labs--> he will return for fasting lab appointment early in the morning. We will also check CBC, c-Met, TSH, prolactin, LH, and cortisol.  #2 chronic right shoulder pain. He has a significant osteoarthritis of the glenohumeral joint as well as signs of rotator cuff impingement syndrome. Glenohumeral injection has helped in the past, most recently about 3 months ago. He is in favor of repeat glenohumeral steroid injection today.  Ultrasound-guided injection is preferred based on studies that show increased duration, increased effect, greater accuracy, decreased procedural pain, increased response rate, and decreased cost with ultrasound-guided versus blind injection. Procedure: Real-time ultrasound guided injection of right glenohumeral joint. Device: [employer] informed consent obtained.  Timeout conducted.  No overlying erythema, induration, or other signs of local  infection. After sterile prep with Betadine, injected 40 mg kenalog  +3 mL of 1% plain lidocaine.  Injectate seen filling shoulder capsule. Patient tolerated the procedure well.  No immediate complications.  Post-injection care discussed. Advised to call if fever/chills, erythema, drainage, or persistent bleeding.  Impression: Technically successful ultrasound-guided injection.  An After Visit Summary was printed and given to the patient.  FOLLOW UP: Return in about 6 weeks (around 03/11/2023) for f/u low testost.  Signed:  Gerlene Hockey, MD           01/28/2023

## 2023-01-29 ENCOUNTER — Other Ambulatory Visit: Payer: 59

## 2023-01-29 DIAGNOSIS — R7989 Other specified abnormal findings of blood chemistry: Secondary | ICD-10-CM

## 2023-01-29 DIAGNOSIS — R5382 Chronic fatigue, unspecified: Secondary | ICD-10-CM

## 2023-01-29 DIAGNOSIS — R6882 Decreased libido: Secondary | ICD-10-CM | POA: Diagnosis not present

## 2023-01-29 DIAGNOSIS — E039 Hypothyroidism, unspecified: Secondary | ICD-10-CM

## 2023-01-31 ENCOUNTER — Encounter: Payer: Self-pay | Admitting: Family Medicine

## 2023-01-31 ENCOUNTER — Other Ambulatory Visit: Payer: Self-pay | Admitting: Family Medicine

## 2023-01-31 MED ORDER — TESTOSTERONE CYPIONATE 200 MG/ML IJ SOLN: INTRAMUSCULAR | 1 refills | Status: AC

## 2023-02-01 ENCOUNTER — Ambulatory Visit (INDEPENDENT_AMBULATORY_CARE_PROVIDER_SITE_OTHER): Payer: 59

## 2023-02-01 DIAGNOSIS — E291 Testicular hypofunction: Secondary | ICD-10-CM | POA: Diagnosis not present

## 2023-02-01 DIAGNOSIS — R7989 Other specified abnormal findings of blood chemistry: Secondary | ICD-10-CM

## 2023-02-01 LAB — TSH: TSH: 3.4 m[IU]/L (ref 0.40–4.50)

## 2023-02-01 LAB — COMPREHENSIVE METABOLIC PANEL
AG Ratio: 2.3 (calc) (ref 1.0–2.5)
ALT: 21 U/L (ref 9–46)
AST: 13 U/L (ref 10–35)
Albumin: 4.3 g/dL (ref 3.6–5.1)
Alkaline phosphatase (APISO): 64 U/L (ref 35–144)
BUN/Creatinine Ratio: 10 (calc) (ref 6–22)
BUN: 17 mg/dL (ref 7–25)
CO2: 29 mmol/L (ref 20–32)
Calcium: 9.5 mg/dL (ref 8.6–10.3)
Chloride: 106 mmol/L (ref 98–110)
Creat: 1.65 mg/dL — ABNORMAL HIGH (ref 0.70–1.35)
Globulin: 1.9 g/dL (ref 1.9–3.7)
Glucose, Bld: 94 mg/dL (ref 65–99)
Potassium: 4.5 mmol/L (ref 3.5–5.3)
Sodium: 142 mmol/L (ref 135–146)
Total Bilirubin: 0.7 mg/dL (ref 0.2–1.2)
Total Protein: 6.2 g/dL (ref 6.1–8.1)

## 2023-02-01 LAB — CBC WITH DIFFERENTIAL/PLATELET
Absolute Lymphocytes: 849 {cells}/uL — ABNORMAL LOW (ref 850–3900)
Absolute Monocytes: 476 {cells}/uL (ref 200–950)
Basophils Absolute: 62 {cells}/uL (ref 0–200)
Basophils Relative: 0.9 %
Eosinophils Absolute: 276 {cells}/uL (ref 15–500)
Eosinophils Relative: 4 %
HCT: 45.8 % (ref 38.5–50.0)
Hemoglobin: 15.4 g/dL (ref 13.2–17.1)
MCH: 31.6 pg (ref 27.0–33.0)
MCHC: 33.6 g/dL (ref 32.0–36.0)
MCV: 93.9 fL (ref 80.0–100.0)
MPV: 10.7 fL (ref 7.5–12.5)
Monocytes Relative: 6.9 %
Neutro Abs: 5237 {cells}/uL (ref 1500–7800)
Neutrophils Relative %: 75.9 %
Platelets: 223 10*3/uL (ref 140–400)
RBC: 4.88 10*6/uL (ref 4.20–5.80)
RDW: 11.8 % (ref 11.0–15.0)
Total Lymphocyte: 12.3 %
WBC: 6.9 10*3/uL (ref 3.8–10.8)

## 2023-02-01 LAB — TESTOSTERONE: Testosterone: 118 ng/dL — ABNORMAL LOW (ref 250–827)

## 2023-02-01 LAB — CORTISOL: Cortisol, Plasma: 16.3 ug/dL

## 2023-02-01 LAB — DHEA-SULFATE: DHEA-SO4: 174 ug/dL (ref 20–217)

## 2023-02-01 LAB — LUTEINIZING HORMONE: LH: 2.8 m[IU]/mL (ref 1.6–15.2)

## 2023-02-01 LAB — PROLACTIN: Prolactin: 7.3 ng/mL (ref 2.0–18.0)

## 2023-02-01 LAB — ACTH: C206 ACTH: 21 pg/mL (ref 6–50)

## 2023-02-01 MED ORDER — TESTOSTERONE CYPIONATE 200 MG/ML IM SOLN
200.0000 mg | Freq: Once | INTRAMUSCULAR | Status: AC
  Administered 2023-02-01: 200 mg via INTRAMUSCULAR

## 2023-02-01 NOTE — Progress Notes (Addendum)
Pt here for testosterone injection per Dr. Milinda Cave.   Pt tolerated injection well. Pt provided first injection today.   Pt administering testosterone at home.

## 2023-02-03 ENCOUNTER — Encounter: Payer: Self-pay | Admitting: Family Medicine

## 2023-02-15 MED ORDER — TESTOSTERONE CYPIONATE 200 MG/ML IM SOLN
200.0000 mg | Freq: Once | INTRAMUSCULAR | Status: AC
  Administered 2023-02-01: 200 mg via INTRAMUSCULAR

## 2023-02-15 NOTE — Addendum Note (Signed)
Addended by: Victorio Palm on: 02/15/2023 02:01 PM   Modules accepted: Orders

## 2023-03-01 ENCOUNTER — Other Ambulatory Visit (HOSPITAL_COMMUNITY): Payer: Self-pay

## 2023-03-01 ENCOUNTER — Encounter: Payer: 59 | Admitting: Family Medicine

## 2023-03-01 ENCOUNTER — Telehealth: Payer: Self-pay

## 2023-03-01 NOTE — Telephone Encounter (Signed)
Pharmacy Patient Advocate Encounter   Received notification from CoverMyMeds that prior authorization for Testosterone Cypionate 200MG /ML intramuscular solution is required/requested.   Insurance verification completed.   The patient is insured through [facility] .   Per test claim: PA required; PA submitted to above mentioned insurance via CoverMyMeds Key/confirmation #/EOC  [id number] Status is pending

## 2023-03-04 NOTE — Telephone Encounter (Signed)
 FYI  Please see below

## 2023-03-04 NOTE — Telephone Encounter (Signed)
 Pharmacy Patient Advocate Encounter  Received notification from [facility] that Prior Authorization for Testosterone Cypionate 200MG /ML intramuscular solution has been DENIED.  See denial reason below. No denial letter attached in CMM. Will attach denial letter to Media tab once received.   PA #/Case ID/Reference #: [id number]

## 2023-03-04 NOTE — Telephone Encounter (Signed)
 Okay, insurance needs TWO low testosterone labs before they will authorize testosterone replacement.  When I saw patient on 01/28/2024 he brought in results from an outside lab, testosterone was 84.6.  I documented the level in my encounter note but please abstract it. This number should count as documentation of low testosterone level #1. The level that I checked on 01/28/2024 should count is #2 (testosterone very low again).  We may have to have patient bring in a copy of the labs so we can show insurance that he meets their criteria/repeat prior Auth process. Thanks

## 2023-03-05 ENCOUNTER — Telehealth: Payer: Self-pay

## 2023-03-05 NOTE — Telephone Encounter (Addendum)
 Pharmacy Patient Advocate Encounter   Received notification from  [employer]  that prior authorization for TESTOSTERONE INJ is required/requested.   Insurance verification completed.   The patient is insured through [facility] .   Per test claim: PA required; PA submitted to above mentioned insurance via CoverMyMeds Key/confirmation #/EOC [id number] Status is pending

## 2023-03-08 NOTE — Telephone Encounter (Signed)
 Pharmacy Patient Advocate Encounter  Received notification from [employer] that Prior Authorization for Testosterone has been DENIED.  Full denial letter will be uploaded to the media tab. See denial reason below.   PA #/Case ID/Reference #: See attached denial letter in media

## 2023-03-19 ENCOUNTER — Other Ambulatory Visit: Payer: Self-pay | Admitting: Family Medicine

## 2023-06-05 ENCOUNTER — Ambulatory Visit

## 2023-06-06 ENCOUNTER — Ambulatory Visit

## 2023-06-07 ENCOUNTER — Encounter: Payer: Self-pay | Admitting: Family Medicine

## 2023-06-07 ENCOUNTER — Ambulatory Visit (INDEPENDENT_AMBULATORY_CARE_PROVIDER_SITE_OTHER): Admitting: Family Medicine

## 2023-06-07 VITALS — BP 137/82 | HR 64 | Temp 98.6°F | Ht 74.0 in | Wt 229.0 lb

## 2023-06-07 DIAGNOSIS — M19011 Primary osteoarthritis, right shoulder: Secondary | ICD-10-CM | POA: Diagnosis not present

## 2023-06-07 DIAGNOSIS — G8929 Other chronic pain: Secondary | ICD-10-CM | POA: Diagnosis not present

## 2023-06-07 DIAGNOSIS — E291 Testicular hypofunction: Secondary | ICD-10-CM

## 2023-06-07 DIAGNOSIS — M25511 Pain in right shoulder: Secondary | ICD-10-CM | POA: Diagnosis not present

## 2023-06-07 MED ORDER — TRIAMCINOLONE ACETONIDE 40 MG/ML IJ SUSP
40.0000 mg | Freq: Once | INTRAMUSCULAR | Status: AC
  Administered 2023-06-07: 40 mg via INTRA_ARTICULAR

## 2023-06-07 NOTE — Addendum Note (Signed)
 Addended by: Shelvia Dick on: 06/07/2023 08:54 AM   Modules accepted: Level of Service

## 2023-06-07 NOTE — Progress Notes (Addendum)
 OFFICE VISIT  06/07/2023  CC:  Chief Complaint  Patient presents with   Shoulder Pain    Right; pain and stiffness with limited ROM, had steroid injection completed 01/28/23    Patient is a 63 y.o. male who presents for right shoulder pain and routine chronic follow-up of hypogonadism. I last saw him on 01/28/2023. A/P as of that visit: "chronic right shoulder pain. He has a significant osteoarthritis of the glenohumeral joint as well as signs of rotator cuff impingement syndrome. Glenohumeral injection has helped in the past, most recently about 3 months ago. He is in favor of repeat glenohumeral steroid injection today."  HPI: Right shoulder aching and stiffness have returned. He is happy to report that he got over 2 months of pain relief after the last injection.  The pain came back just gradual and now is aching all the time.  He is interested in another steroid injection.  He feels good positive effects from testosterone  100 mg every 2 weeks. No adverse effects.  Past Medical History:  Diagnosis Date   Acquired hypothyroidism    Asthma    Exercise-induced   Chronic renal insufficiency, stage 3 (moderate) (HCC)    CVA (cerebral vascular accident) (HCC)    02/2021--abnormal MRI scan showing remote age left parietal occipital and left parietal embolic silent infarcts of cryptogenic etiology.  Echo was good.  Event monitor ordered but not done   Hyperlipidemia    Patient declines statin   Lumbar spondylosis    Osteoarthritis of glenohumeral joint, right    Secondary male hypogonadism    1st testost inj 02/01/23    Past Surgical History:  Procedure Laterality Date   KNEE ARTHROSCOPY  01/16/2000   bilat   TRANSTHORACIC ECHOCARDIOGRAM     02/2021 normal except Grd I DD, also aortic root dilation 44 mm    Outpatient Medications Prior to Visit  Medication Sig Dispense Refill   aspirin EC 81 MG tablet Take 81 mg by mouth daily. Swallow whole.     levothyroxine  (SYNTHROID ) 50  MCG tablet TAKE 1 TABLET BY MOUTH EVERY DAY 30 tablet 2   Testosterone  Cypionate 200 MG/ML SOLN 1 ml IM q 2 weeks (Patient taking differently: 0.5 ml IM q 2 weeks) 6 mL 1   albuterol (PROVENTIL HFA;VENTOLIN HFA) 108 (90 BASE) MCG/ACT inhaler Inhale 1-2 puffs into the lungs every 6 (six) hours as needed for wheezing or shortness of breath (seasonal with pollen). (Patient not taking: Reported on 06/07/2023)     No facility-administered medications prior to visit.    Allergies  Allergen Reactions   Penicillins Rash    Review of Systems  As per HPI  PE:    06/07/2023    8:16 AM 01/28/2023    1:05 PM 10/29/2022    8:21 AM  Vitals with BMI  Height 6\' 2"     Weight 229 lbs 232 lbs 3 oz   BMI 29.39    Systolic 137 132 161  Diastolic 82 87 66  Pulse 64 65     Physical Exam  Gen: Alert, well appearing.  Patient is oriented to person, place, time, and situation. Right shoulder without tenderness to palpation.  He has good active range of motion but has to go through a lot of stiffness to get full abduction and internal and external rotation. Neck range of motion is fully intact.  Neurovascularly intact. LABS:  Last CBC Lab Results  Component Value Date   WBC 6.9 01/29/2023   HGB 15.4  01/29/2023   HCT 45.8 01/29/2023   MCV 93.9 01/29/2023   MCH 31.6 01/29/2023   RDW 11.8 01/29/2023   PLT 223 01/29/2023   Last metabolic panel Lab Results  Component Value Date   GLUCOSE 94 01/29/2023   NA 142 01/29/2023   K 4.5 01/29/2023   CL 106 01/29/2023   CO2 29 01/29/2023   BUN 17 01/29/2023   CREATININE 1.65 (H) 01/29/2023   GFR 53.95 (L) 02/21/2022   CALCIUM  9.5 01/29/2023   PROT 6.2 01/29/2023   ALBUMIN 4.3 02/21/2022   BILITOT 0.7 01/29/2023   ALKPHOS 30 (L) 02/21/2022   AST 13 01/29/2023   ALT 21 01/29/2023   ANIONGAP 8 03/02/2021   Last lipids Lab Results  Component Value Date   CHOL 218 (H) 02/21/2022   HDL 24.20 (L) 02/21/2022   LDLCALC 176 (H) 02/21/2022   TRIG  92.0 02/21/2022   CHOLHDL 9 02/21/2022   Last hemoglobin A1c Lab Results  Component Value Date   HGBA1C 5.1 02/21/2022   Last thyroid  functions Lab Results  Component Value Date   TSH 3.40 01/29/2023   Lab Results  Component Value Date   PSA 1.44 02/21/2022    IMPRESSION AND PLAN:  #1 chronic right shoulder pain due to GH osteoarthritis. Good response to steroid injections in the past. Injection today.  Ultrasound-guided injection is preferred based on studies that show increased duration, increased effect, greater accuracy, decreased procedural pain, increased response rate, and decreased cost with ultrasound-guided versus blind injection. Procedure: Real-time ultrasound guided injection of right glenohumeral joint. Device: [employer] informed consent obtained.  Timeout conducted.  No overlying erythema, induration, or other signs of local infection. After sterile prep with Betadine, injected mixture of 40 mg Kenalog  and 3 mL of 1% plain lidocaine.  Injectate seen filling glenohumeral joint space. Patient tolerated the procedure well.  No immediate complications.  Post-injection care discussed. Advised to call if fever/chills, erythema, drainage, or persistent bleeding.  Impression: Technically successful ultrasound-guided injection.  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 Doing well long-term on 100 mg testosterone  injection every 2 weeks. We will recheck CBC and PSA at next follow-up in 6 months.  An After Visit Summary was printed and given to the patient.  FOLLOW UP: Return in 6 months (on 12/08/2023), or if symptoms worsen or fail to improve, for annual CPE (fasting).  Signed:  Arletha Lady, MD           06/07/2023

## 2023-06-12 ENCOUNTER — Other Ambulatory Visit: Payer: Self-pay | Admitting: Family Medicine

## 2023-07-12 DIAGNOSIS — Z1231 Encounter for screening mammogram for malignant neoplasm of breast: Secondary | ICD-10-CM | POA: Diagnosis not present

## 2023-09-25 ENCOUNTER — Ambulatory Visit: Admitting: Family Medicine

## 2023-09-27 ENCOUNTER — Encounter: Payer: Self-pay | Admitting: Family Medicine

## 2023-09-27 ENCOUNTER — Ambulatory Visit: Admitting: Family Medicine

## 2023-09-27 VITALS — BP 136/80 | HR 69 | Temp 99.2°F | Ht 74.0 in | Wt 237.2 lb

## 2023-09-27 DIAGNOSIS — G8929 Other chronic pain: Secondary | ICD-10-CM

## 2023-09-27 DIAGNOSIS — M25511 Pain in right shoulder: Secondary | ICD-10-CM

## 2023-09-27 DIAGNOSIS — M19011 Primary osteoarthritis, right shoulder: Secondary | ICD-10-CM

## 2023-09-27 MED ORDER — TRIAMCINOLONE ACETONIDE 40 MG/ML IJ SUSP
40.0000 mg | Freq: Once | INTRAMUSCULAR | Status: AC
  Administered 2023-09-27: 40 mg via INTRA_ARTICULAR

## 2023-09-27 NOTE — Addendum Note (Signed)
 Addended by: FLETA CARE D on: 09/27/2023 03:13 PM   Modules accepted: Orders

## 2023-09-27 NOTE — Progress Notes (Signed)
 OFFICE VISIT  09/27/2023  CC:  Chief Complaint  Patient presents with   Shoulder Pain    Right     Patient is a 63 y.o. male who presents for right shoulder pain. A/P as of last visit 06/07/23:  Chronic right shoulder pain due to [facility] osteoarthritis. Good response to steroid injections in the past. Injection today.  HPI: Recurrence of Right shoulder pain the last 3 weeks. Last injection in [facility] jt R shoulder was 06/07/23 and helped very well for about 8-10 wks.  ROM painful but not limited. No neck pain or radiating arm pain or paresthesias.    Past Medical History:  Diagnosis Date   Acquired hypothyroidism    Asthma    Exercise-induced   Chronic renal insufficiency, stage 3 (moderate) (HCC)    CVA (cerebral vascular accident) (HCC)    02/2021--abnormal MRI scan showing remote age left parietal occipital and left parietal embolic silent infarcts of cryptogenic etiology.  Echo was good.  Event monitor ordered but not done   Hyperlipidemia    Patient declines statin   Lumbar spondylosis    Osteoarthritis of glenohumeral joint, right    Secondary male hypogonadism    1st testost inj 02/01/23    Past Surgical History:  Procedure Laterality Date   KNEE ARTHROSCOPY  01/16/2000   bilat   TRANSTHORACIC ECHOCARDIOGRAM     02/2021 normal except Grd I DD, also aortic root dilation 44 mm    Outpatient Medications Prior to Visit  Medication Sig Dispense Refill   albuterol (PROVENTIL HFA;VENTOLIN HFA) 108 (90 BASE) MCG/ACT inhaler Inhale 1-2 puffs into the lungs every 6 (six) hours as needed for wheezing or shortness of breath (seasonal with pollen).     aspirin EC 81 MG tablet Take 81 mg by mouth daily. Swallow whole.     levothyroxine  (SYNTHROID ) 50 MCG tablet TAKE 1 TABLET BY MOUTH EVERY DAY 30 tablet 5   Testosterone  Cypionate 200 MG/ML SOLN 1 ml IM q 2 weeks (Patient taking differently: 0.5 ml IM q 2 weeks) 6 mL 1   No facility-administered medications prior to visit.     Allergies  Allergen Reactions   Penicillins Rash    Review of Systems  As per HPI  PE:    09/27/2023    1:00 PM 06/07/2023    8:16 AM 01/28/2023    1:05 PM  Vitals with BMI  Height 6' 2 6' 2   Weight 237 lbs 3 oz 229 lbs 232 lbs 3 oz  BMI 30.44 29.39   Systolic 136 137 867  Diastolic 80 82 87  Pulse 69 64 65     Physical Exam  Gen: Alert, well appearing.  Patient is oriented to person, place, time, and situation. AFFECT: pleasant, lucid thought and speech. R shoulder w/out form any, asymmetry, or tenderness. He has about 20 degrees less than full external and internal rotation.  Positive supraspinatus sign. Negative drop sign.  Positive Hawkins and Neer's.  Speeds and Yergason's negative.  Arm strength 5 out of 5 proximally and distally.  LABS:  Last metabolic panel Lab Results  Component Value Date   GLUCOSE 94 01/29/2023   NA 142 01/29/2023   K 4.5 01/29/2023   CL 106 01/29/2023   CO2 29 01/29/2023   BUN 17 01/29/2023   CREATININE 1.65 (H) 01/29/2023   GFR 53.95 (L) 02/21/2022   CALCIUM  9.5 01/29/2023   PROT 6.2 01/29/2023   ALBUMIN 4.3 02/21/2022   BILITOT 0.7 01/29/2023  ALKPHOS 30 (L) 02/21/2022   AST 13 01/29/2023   ALT 21 01/29/2023   ANIONGAP 8 03/02/2021   Last hemoglobin A1c Lab Results  Component Value Date   HGBA1C 5.1 02/21/2022   Lab Results  Component Value Date   WBC 6.9 01/29/2023   HGB 15.4 01/29/2023   HCT 45.8 01/29/2023   MCV 93.9 01/29/2023   PLT 223 01/29/2023   Lab Results  Component Value Date   TESTOSTERONE  118 (L) 01/29/2023   IMPRESSION AND PLAN:  1 chronic right shoulder pain due to [facility] osteoarthritis. Good response to steroid injections in the past. Injection today.   Ultrasound-guided injection is preferred based on studies that show increased duration, increased effect, greater accuracy, decreased procedural pain, increased response rate, and decreased cost with ultrasound-guided versus blind  injection. Procedure: Real-time ultrasound guided injection of right glenohumeral joint. Device: [employer] informed consent obtained.  Timeout conducted.  No overlying erythema, induration, or other signs of local infection. After sterile prep with Betadine, injected mixture of 40 mg Kenalog  and 3 mL of 1% plain lidocaine.  Injectate seen filling glenohumeral joint space. Patient tolerated the procedure well.  No immediate complications.  Post-injection care discussed. Advised to call if fever/chills, erythema, drainage, or persistent bleeding.  Impression: Technically successful ultrasound-guided injection.  An After Visit Summary was printed and given to the patient.  FOLLOW UP: No follow-ups on file.  Signed:  Gerlene Hockey, MD           09/27/2023

## 2023-11-28 ENCOUNTER — Other Ambulatory Visit: Payer: Self-pay | Admitting: Family Medicine

## 2023-12-30 ENCOUNTER — Encounter: Payer: Self-pay | Admitting: Family Medicine

## 2023-12-30 ENCOUNTER — Ambulatory Visit

## 2023-12-31 ENCOUNTER — Ambulatory Visit: Admitting: Family Medicine

## 2023-12-31 ENCOUNTER — Encounter: Payer: Self-pay | Admitting: Family Medicine

## 2023-12-31 VITALS — BP 126/71 | HR 78 | Temp 97.6°F | Ht 74.0 in | Wt 241.6 lb

## 2023-12-31 DIAGNOSIS — G8929 Other chronic pain: Secondary | ICD-10-CM | POA: Diagnosis not present

## 2023-12-31 DIAGNOSIS — M25511 Pain in right shoulder: Secondary | ICD-10-CM

## 2023-12-31 DIAGNOSIS — M19011 Primary osteoarthritis, right shoulder: Secondary | ICD-10-CM

## 2023-12-31 MED ORDER — METHYLPREDNISOLONE ACETATE 40 MG/ML IJ SUSP
40.0000 mg | Freq: Once | INTRAMUSCULAR | Status: AC
  Administered 2023-12-31: 13:00:00 40 mg via INTRA_ARTICULAR

## 2023-12-31 NOTE — Progress Notes (Signed)
 OFFICE VISIT  12/31/2023  CC:  Chief Complaint  Patient presents with   Shoulder Pain    Right; last injection helped but did not last as long, pt concerned injection may becoming ineffective.    Patient is a 63 y.o. male who presents for right shoulder pain.  HPI: He has chronic right shoulder pain due to [facility] osteoarthritis. Good response to steroid injections in the past.  Recurrence of Right shoulder pain the last 4 weeks, primarily posteriorly and somewhat over the right posterolateral deltoid region.  Definite stiffness. Has significant trouble AB ducting and internally and externally rotating. Last injection in [facility] jt R shoulder was 09/27/23 and helped very well for about 4 wks.  His prior injections have brought significant pain relief for a longer time than the most recent one.  No neck pain, no arm paresthesias.  Past Medical History:  Diagnosis Date   Acquired hypothyroidism    Allergy    Peniciilin   Asthma    Exercise-induced   Chronic renal insufficiency, stage 3 (moderate)    CVA (cerebral vascular accident) (HCC)    02/2021--abnormal MRI scan showing remote age left parietal occipital and left parietal embolic silent infarcts of cryptogenic etiology.  Echo was good.  Event monitor ordered but not done   Hyperlipidemia    Patient declines statin   Lumbar spondylosis    Medial meniscus tear    2018 Left knee, bucket handle tear.  2002 Right posterior horn.   Osteoarthritis of glenohumeral joint, right    Osteoarthritis of left knee    Secondary male hypogonadism    1st testost inj 02/01/23    Past Surgical History:  Procedure Laterality Date   EYE SURGERY     HERNIA REPAIR     KNEE ARTHROSCOPY  01/16/2000   bilat   TRANSTHORACIC ECHOCARDIOGRAM     02/2021 normal except Grd I DD, also aortic root dilation 44 mm    Outpatient Medications Prior to Visit  Medication Sig Dispense Refill   albuterol (PROVENTIL HFA;VENTOLIN HFA) 108 (90 BASE) MCG/ACT inhaler  Inhale 1-2 puffs into the lungs every 6 (six) hours as needed for wheezing or shortness of breath (seasonal with pollen).     aspirin EC 81 MG tablet Take 81 mg by mouth daily. Swallow whole.     levothyroxine  (SYNTHROID ) 50 MCG tablet TAKE 1 TABLET BY MOUTH EVERY DAY 30 tablet 3   Testosterone  Cypionate 200 MG/ML SOLN 1 ml IM q 2 weeks (Patient taking differently: 0.5 ml IM weekly) 6 mL 1   No facility-administered medications prior to visit.    Allergies[1]  Review of Systems  As per HPI  PE:    12/31/2023   10:57 AM 09/27/2023    1:00 PM 06/07/2023    8:16 AM  Vitals with BMI  Height 6' 2 6' 2 6' 2  Weight 241 lbs 10 oz 237 lbs 3 oz 229 lbs  BMI 31.01 30.44 29.39  Systolic 126 136 862  Diastolic 71 80 82  Pulse 78 69 64     Physical Exam  General: Alert and well-appearing Right shoulder without tenderness to palpation.  Abduction limited to about 80 degrees, internal and external rotation moderately limited.  Arm strength 5 out of 5 proximally and distally bilaterally.   LABS:   Lab Results  Component Value Date   WBC 6.9 01/29/2023   HGB 15.4 01/29/2023   HCT 45.8 01/29/2023   MCV 93.9 01/29/2023   PLT 223 01/29/2023  Last metabolic panel Lab Results  Component Value Date   GLUCOSE 94 01/29/2023   NA 142 01/29/2023   K 4.5 01/29/2023   CL 106 01/29/2023   CO2 29 01/29/2023   BUN 17 01/29/2023   CREATININE 1.65 (H) 01/29/2023   GFR 53.95 (L) 02/21/2022   CALCIUM  9.5 01/29/2023   PROT 6.2 01/29/2023   ALBUMIN 4.3 02/21/2022   BILITOT 0.7 01/29/2023   ALKPHOS 30 (L) 02/21/2022   AST 13 01/29/2023   ALT 21 01/29/2023   ANIONGAP 8 03/02/2021   Lab Results  Component Value Date   TSH 3.40 01/29/2023   Lab Results  Component Value Date   TESTOSTERONE  118 (L) 01/29/2023   IMPRESSION AND PLAN:  Acute on chronic right shoulder pain due to significant glenohumeral osteoarthritis. Unfortunately the last injection only helped for about 4 weeks. He  wants to move forward with another steroid injection to see how much benefit he gets before moving forward with orthopedist evaluation.  Ultrasound-guided injection is preferred based on studies that show increased duration, increased effect, greater accuracy, decreased procedural pain, increased response rate, and decreased cost with ultrasound-guided versus blind injection. Procedure: Real-time ultrasound guided injection of RIGHT shoulder GH jt. Device: [employer] informed consent obtained.  Timeout conducted.  No overlying erythema, induration, or other signs of local infection. After sterile prep with Betadine, injected a mixture of 40 mg Depo-Medrol  +3 mL of 1% plain lidocaine.  Injectate seen filling the joint capsule. Patient tolerated the procedure well.  No immediate complications.  Post-injection care discussed. Advised to call if fever/chills, erythema, drainage, or persistent bleeding.  Impression: Technically successful ultrasound-guided injection.  An After Visit Summary was printed and given to the patient.  FOLLOW UP: Return if symptoms worsen or fail to improve.  Signed:  Gerlene Hockey, MD           12/31/2023       [1]  Allergies Allergen Reactions   Penicillins Rash

## 2023-12-31 NOTE — Addendum Note (Signed)
 Addended by: FLETA CARE D on: 12/31/2023 01:14 PM   Modules accepted: Orders

## 2024-02-14 IMAGING — MR MR HEAD W/O CM
9 of 10 series · 36 of 48 positions shown · non-contrast
Comparison: CT head March 02, 2021.

CLINICAL DATA: Neuro deficit, acute, stroke suspected ? posterior
circulation CVA

EXAM:
MRI HEAD WITHOUT CONTRAST
TECHNIQUE: Multiplanar, multiecho pulse sequences of the brain and surrounding
structures were obtained without intravenous contrast.

[Series 3: DWI · axial · 3.0mm · 1.09mm/px · z∈[-108,+63]mm · 8 of 116 slices shown (1 of 4)]
[im 1/116]
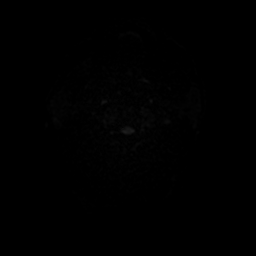
[im 13/116]
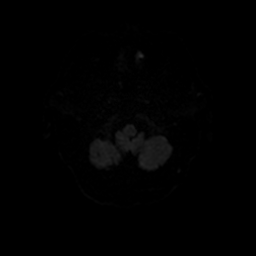
[im 39/116]
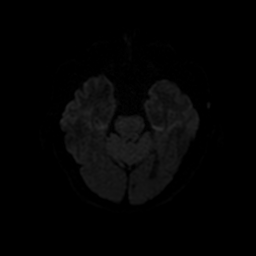
[im 52/116]
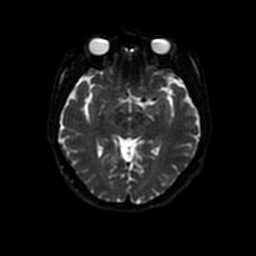
[im 64/116]
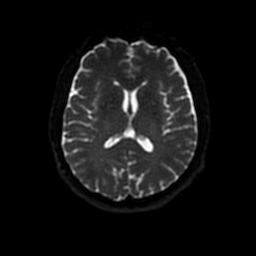
[im 77/116]
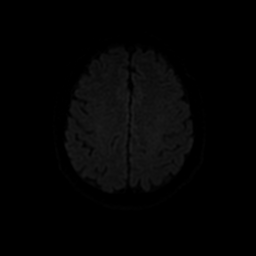
[im 103/116]
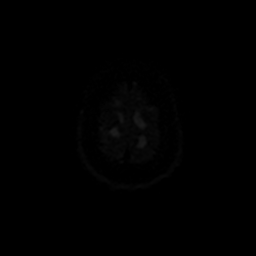
[im 116/116]
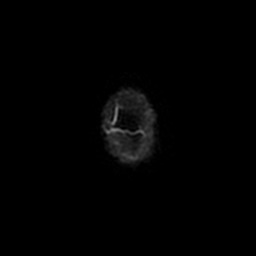

[Series 4: DWI · coronal · 5.0mm · 1.09mm/px · 7 of 80 slices shown (2 of 4)]
[im 1/80]
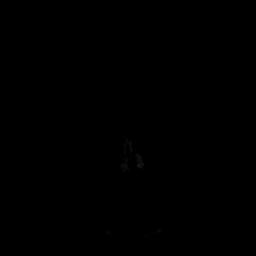
[im 14/80]
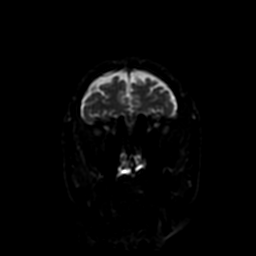
[im 27/80]
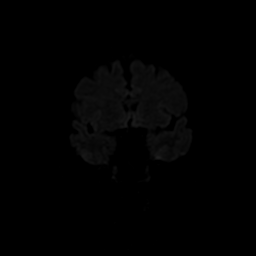
[im 40/80]
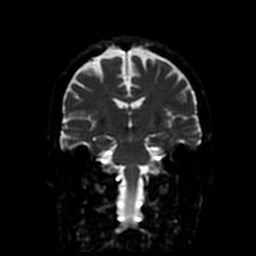
[im 53/80]
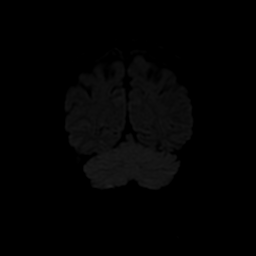
[im 66/80]
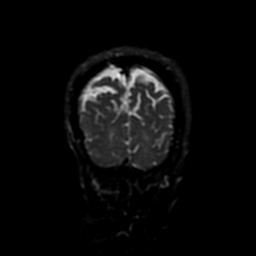
[im 80/80]
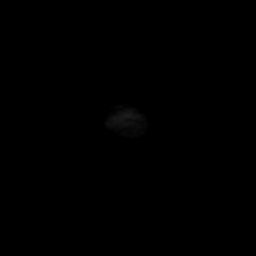

[Series 5: T1 · sagittal · 5.0mm · 0.49mm/px · 2 of 23 slices shown (1 of 2)]
[im 1/23]
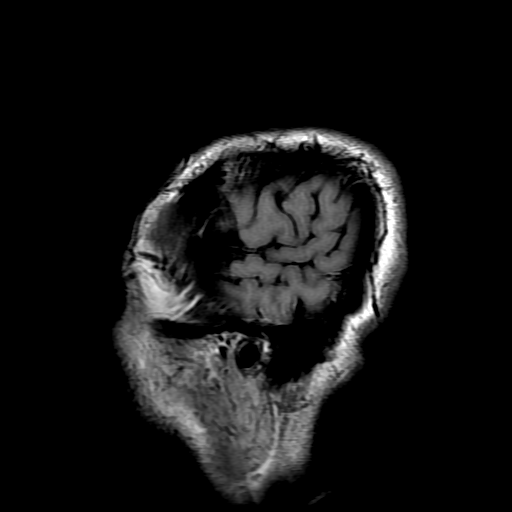
[im 23/23]
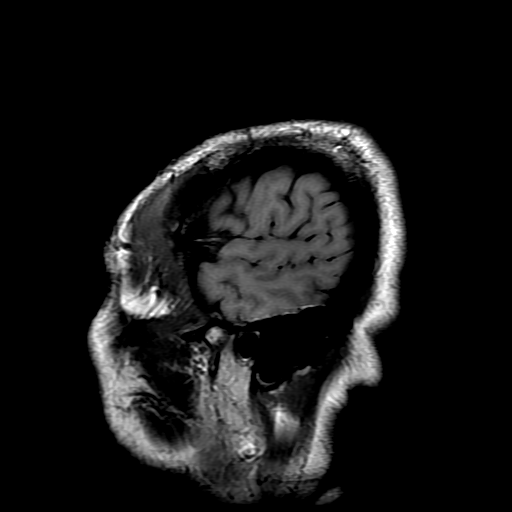

[Series 6: T2 · axial · 5.0mm · 0.49mm/px · z∈[-112,+50]mm · 3 of 28 slices shown (1 of 2)]
[im 1/28]
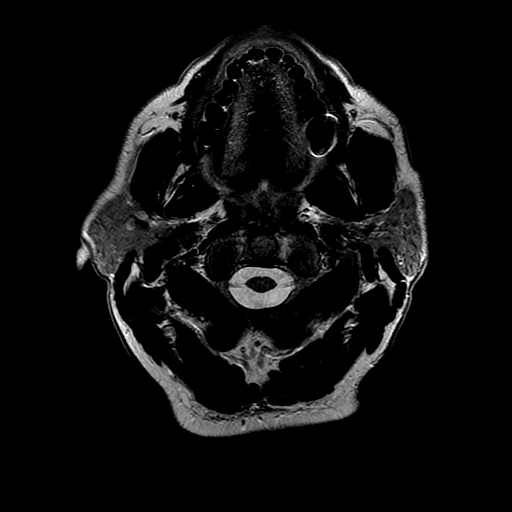
[im 14/28]
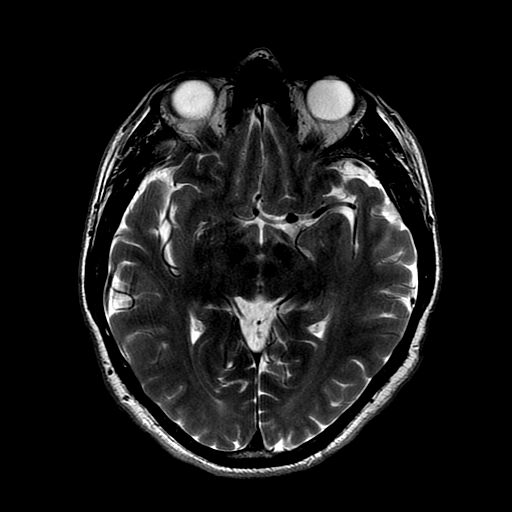
[im 28/28]
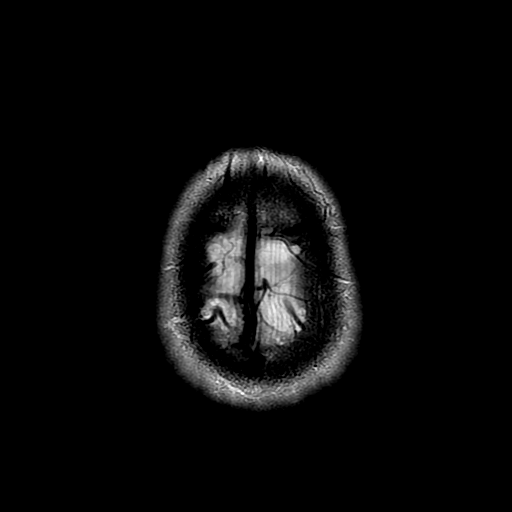

[Series 7: FLAIR · axial · 3.0mm · 0.49mm/px · z∈[-112,+50]mm · 3 of 28 slices shown]
[im 1/28]
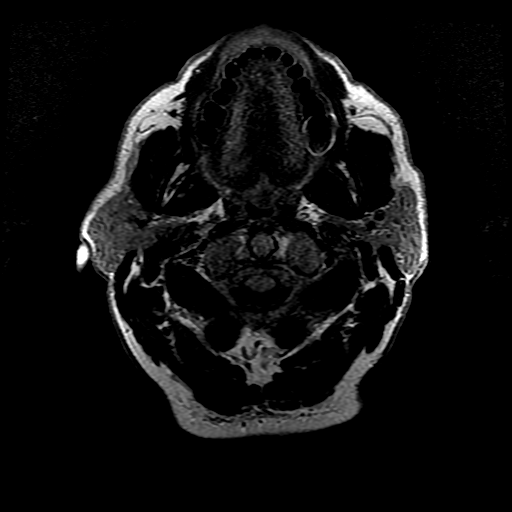
[im 14/28]
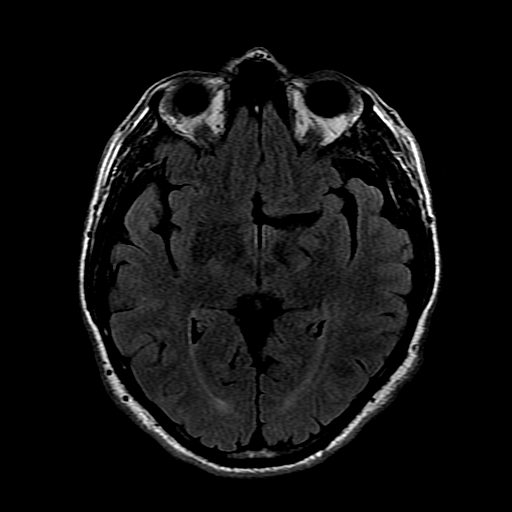
[im 28/28]
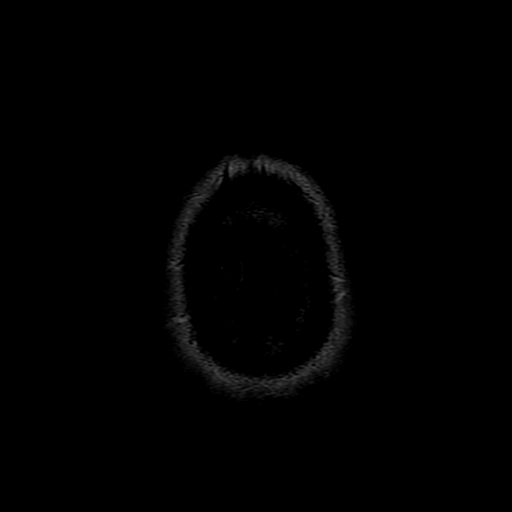

[Series 9: T1 · axial · 3.0mm · 0.49mm/px · z∈[-114,-96]mm · 2 of 112 slices shown (2 of 2)]
[im 1/112]
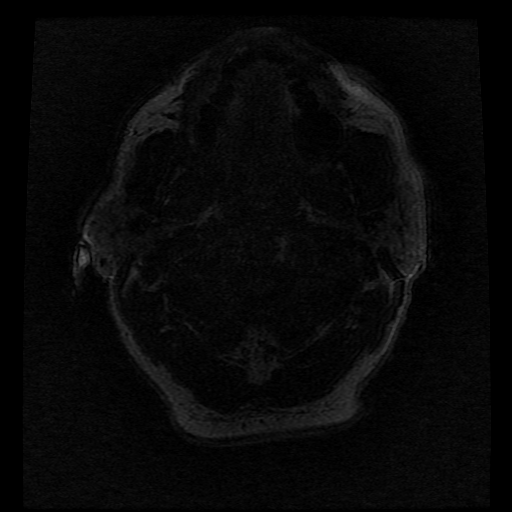
[im 13/112]
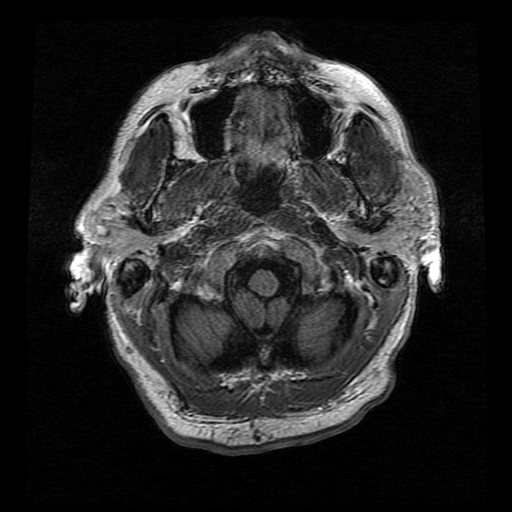

[Series 10: T2 · coronal · 5.0mm · 0.43mm/px · 2 of 24 slices shown (2 of 2)]
[im 1/24]
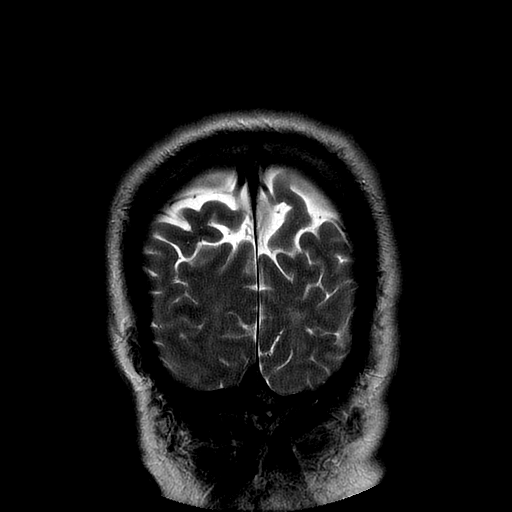
[im 24/24]
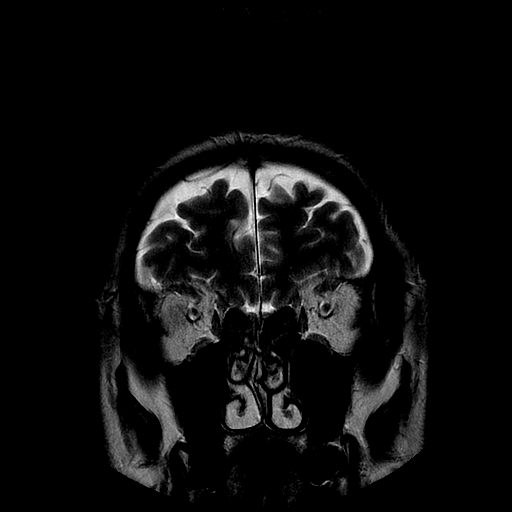

[Series 300: DWI · axial · 3.0mm · 1.09mm/px · z∈[-108,+63]mm · 5 of 58 slices shown (3 of 4)]
[im 1/58]
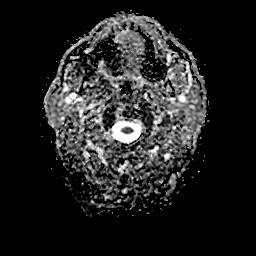
[im 15/58]
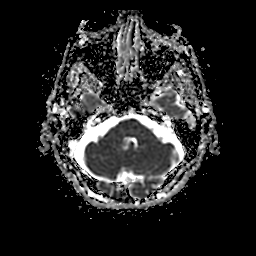
[im 29/58]
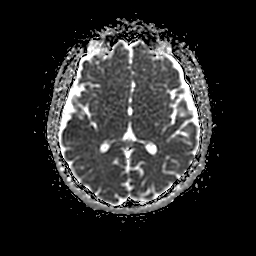
[im 43/58]
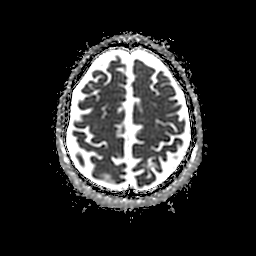
[im 58/58]
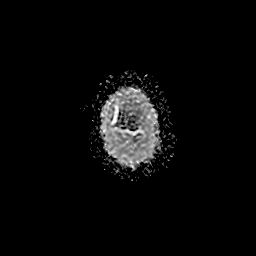

[Series 400: DWI · coronal · 5.0mm · 1.09mm/px · 4 of 40 slices shown (4 of 4)]
[im 1/40]
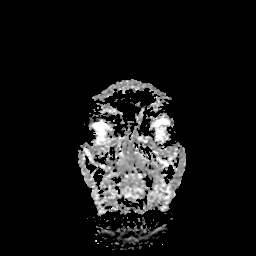
[im 14/40]
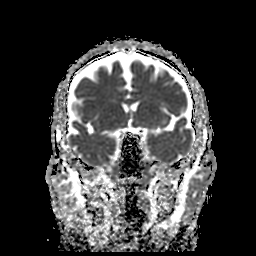
[im 27/40]
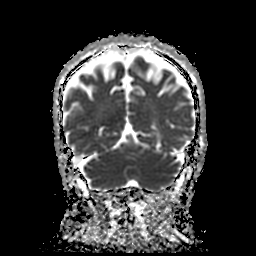
[im 40/40]
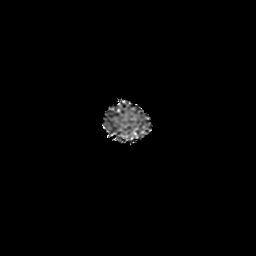

[36 of 48 positions shown; findings below may reference images not displayed]

FINDINGS: Brain: No acute infarction, hemorrhage, hydrocephalus, extra-axial
collection or mass lesion. Small remote infarct in the left parietal
cortex. Additional remote infarct in the medial left
occipitotemporal region. Tiny remote infarct in the right
cerebellum. Mild cerebral atrophy.

Vascular: Major arterial flow voids are maintained at the skull
base.

Skull and upper cervical spine: Normal marrow signal.

Sinuses/Orbits: Clear sinuses.  Unremarkable orbits.

Other: No sizable mastoid effusions.
IMPRESSION: 1. No evidence of acute intracranial abnormality.
2. Small remote infarcts in the medial left occipitotemporal region
and left parietal cortex. Tiny remote infarct in the right
cerebellum.

## 2024-02-14 IMAGING — CT CT HEAD W/O CM
4 series · 17 of 47 positions shown, 19 images · non-contrast
Comparison: None.

CLINICAL DATA: Altered mental status, dizziness



[Series 2: head wo · axial · 0.46mm/px · z∈[+961,+1086]mm · 7 of 35 slices shown, 9 images]
[im 5/35  brain]
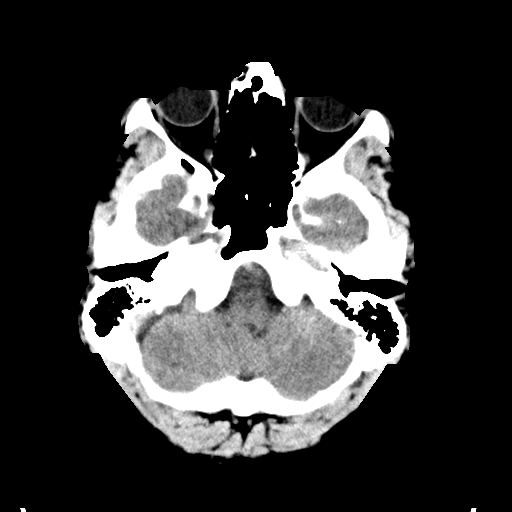
[im 5/35  bone]
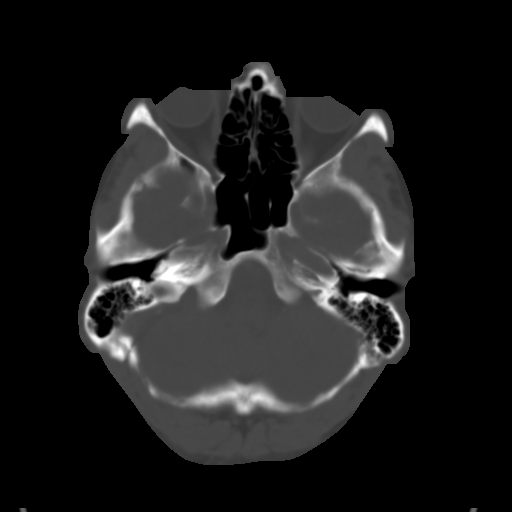
[im 9/35  brain]
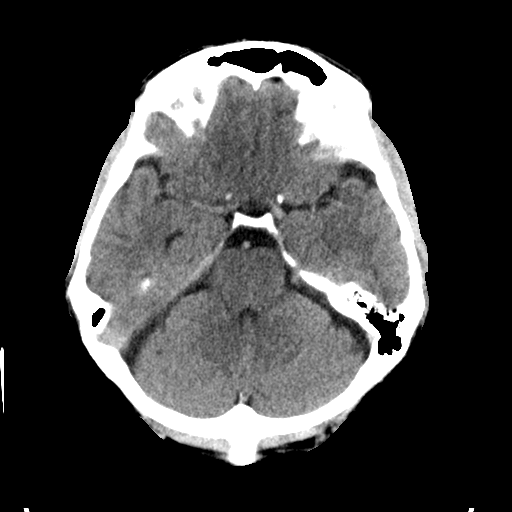
[im 13/35  brain]
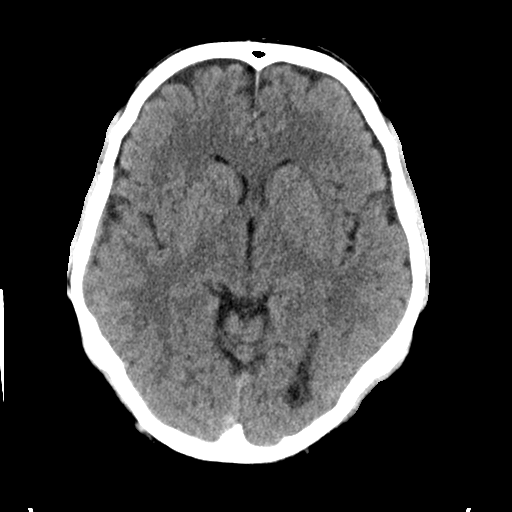
[im 18/35  brain]
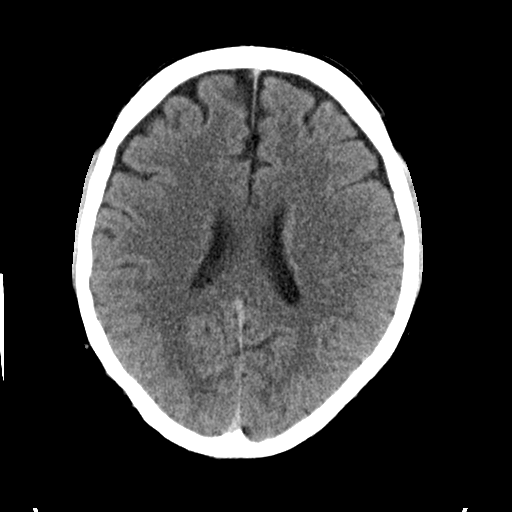
[im 22/35  brain]
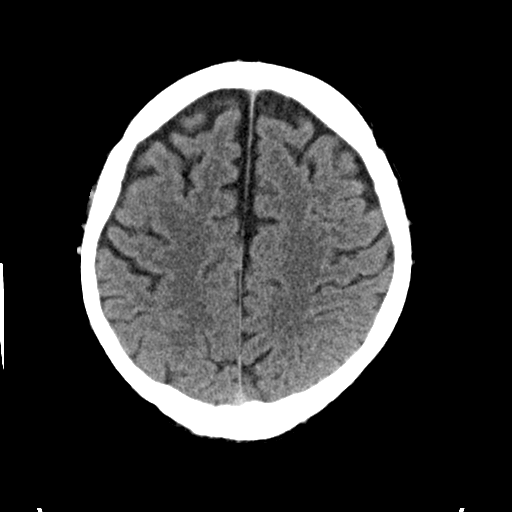
[im 22/35  bone]
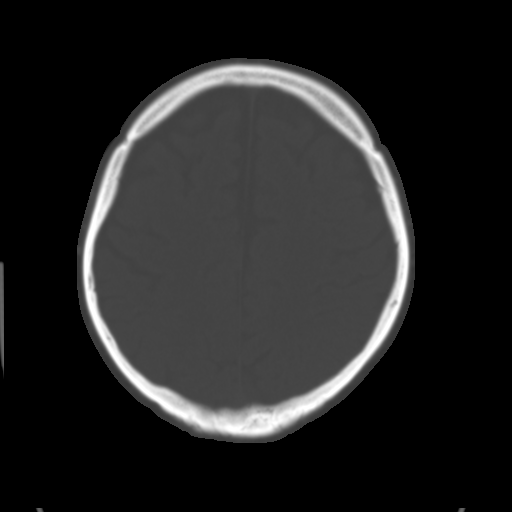
[im 26/35  brain]
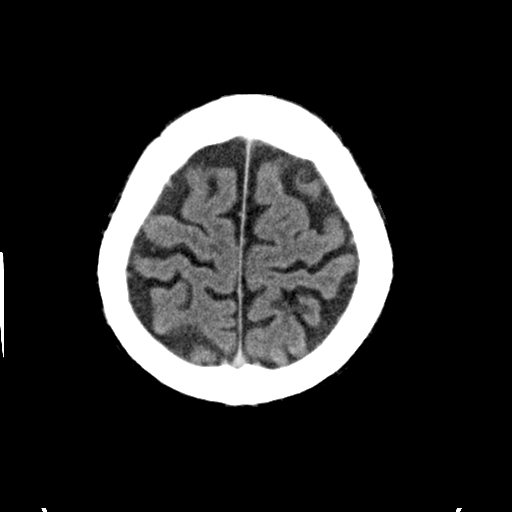
[im 30/35  brain]
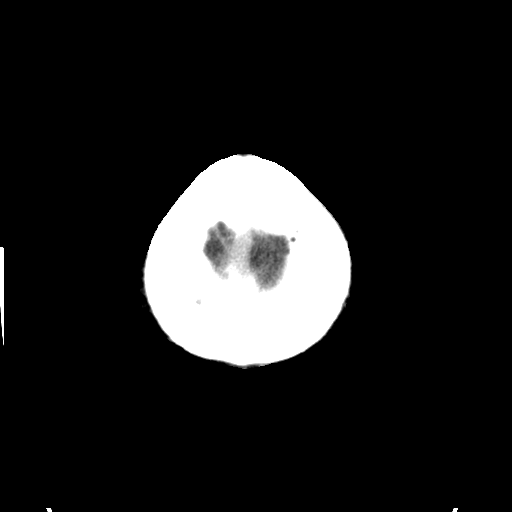

[Series 3: head bone · axial · 0.46mm/px · z∈[+957,+1017]mm · 4 of 87 slices shown]
[im 9/87  bone]
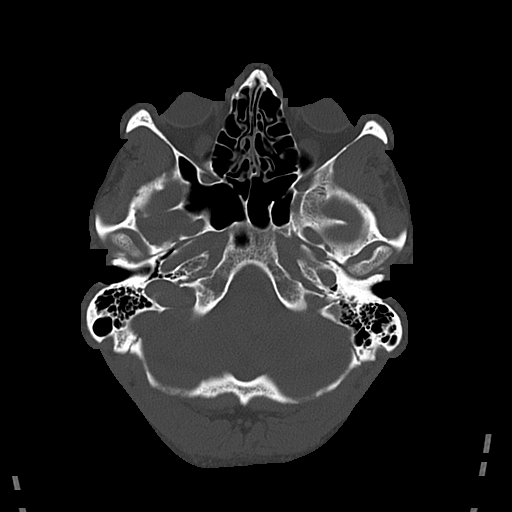
[im 18/87  bone]
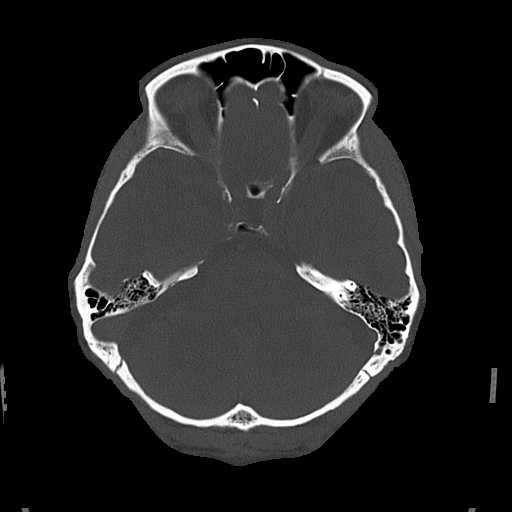
[im 26/87  bone]
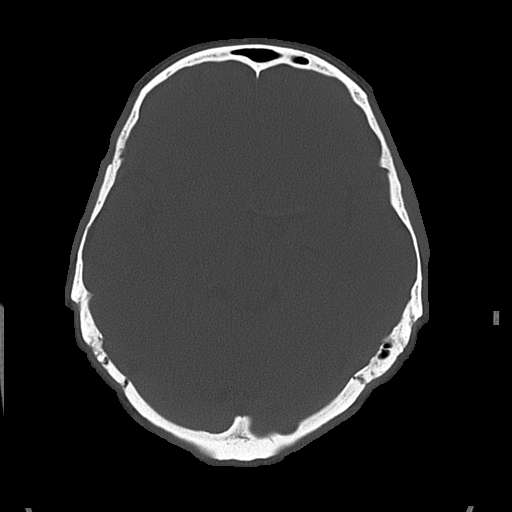
[im 39/87  bone]
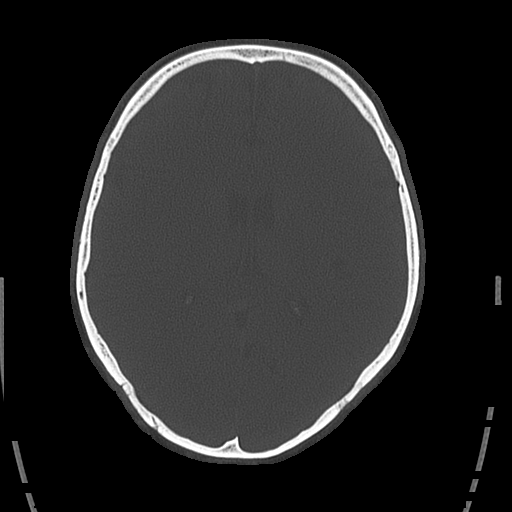

[Series 4: coronal soft · coronal · 0.35mm/px · 3 of 72 slices shown]
[im 24/72  brain]
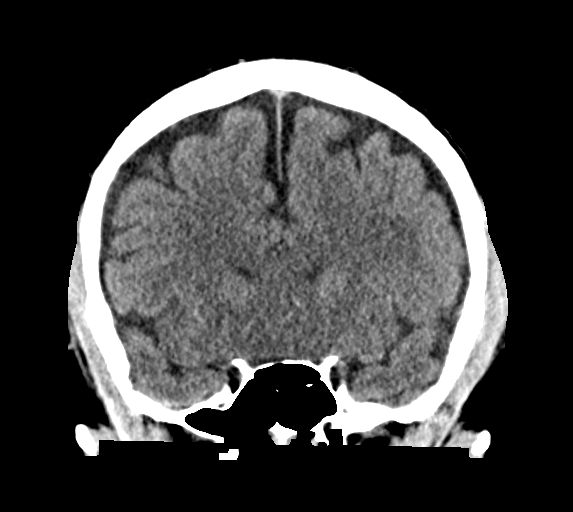
[im 32/72  brain]
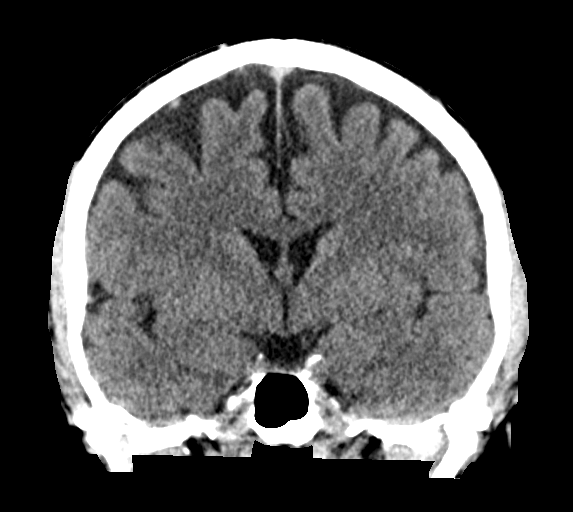
[im 40/72  brain]
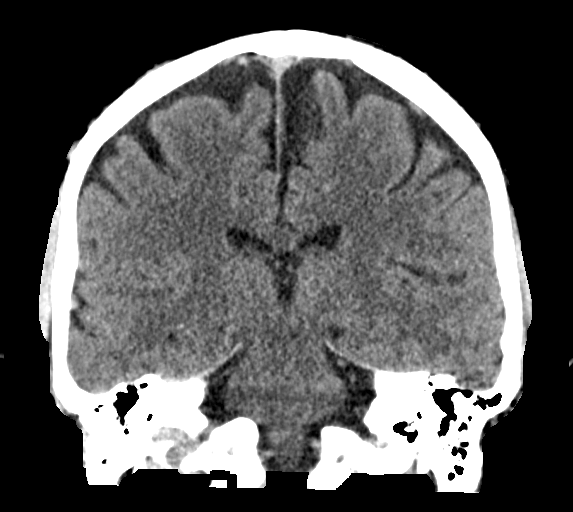

[Series 5: sagittal soft · sagittal · 0.35mm/px · 3 of 67 slices shown]
[im 23/67  brain]
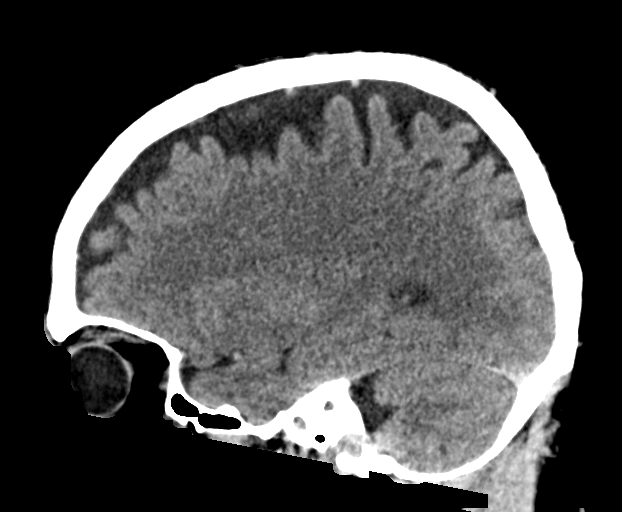
[im 34/67  brain]
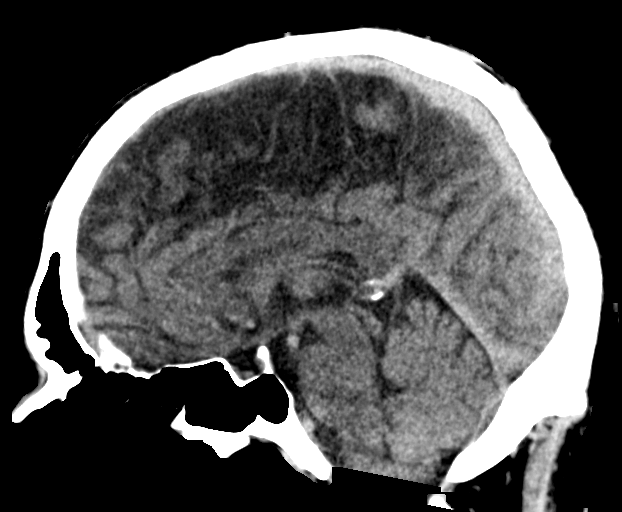
[im 45/67  brain]
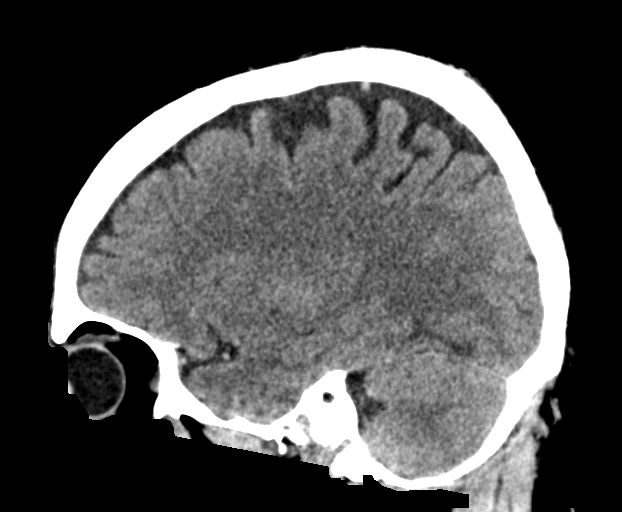

[17 of 47 positions shown; findings below may reference images not displayed]

FINDINGS: Brain: No acute intracranial findings are seen. Ventricles are not
dilated. Cortical sulci are prominent. There is no focal mass
effect.

Vascular: Unremarkable.

Skull: No fracture is seen.

Sinuses/Orbits: Unremarkable.

Other: None
IMPRESSION: No acute intracranial findings are seen.  Atrophy.

## 2024-02-14 IMAGING — DX DG CHEST 2V
2 series · 2 of 2 positions shown · non-contrast
Comparison: 11/18/2008

CLINICAL DATA: Dizziness, near syncope

EXAM:
CHEST - 2 VIEW

[chest pa]
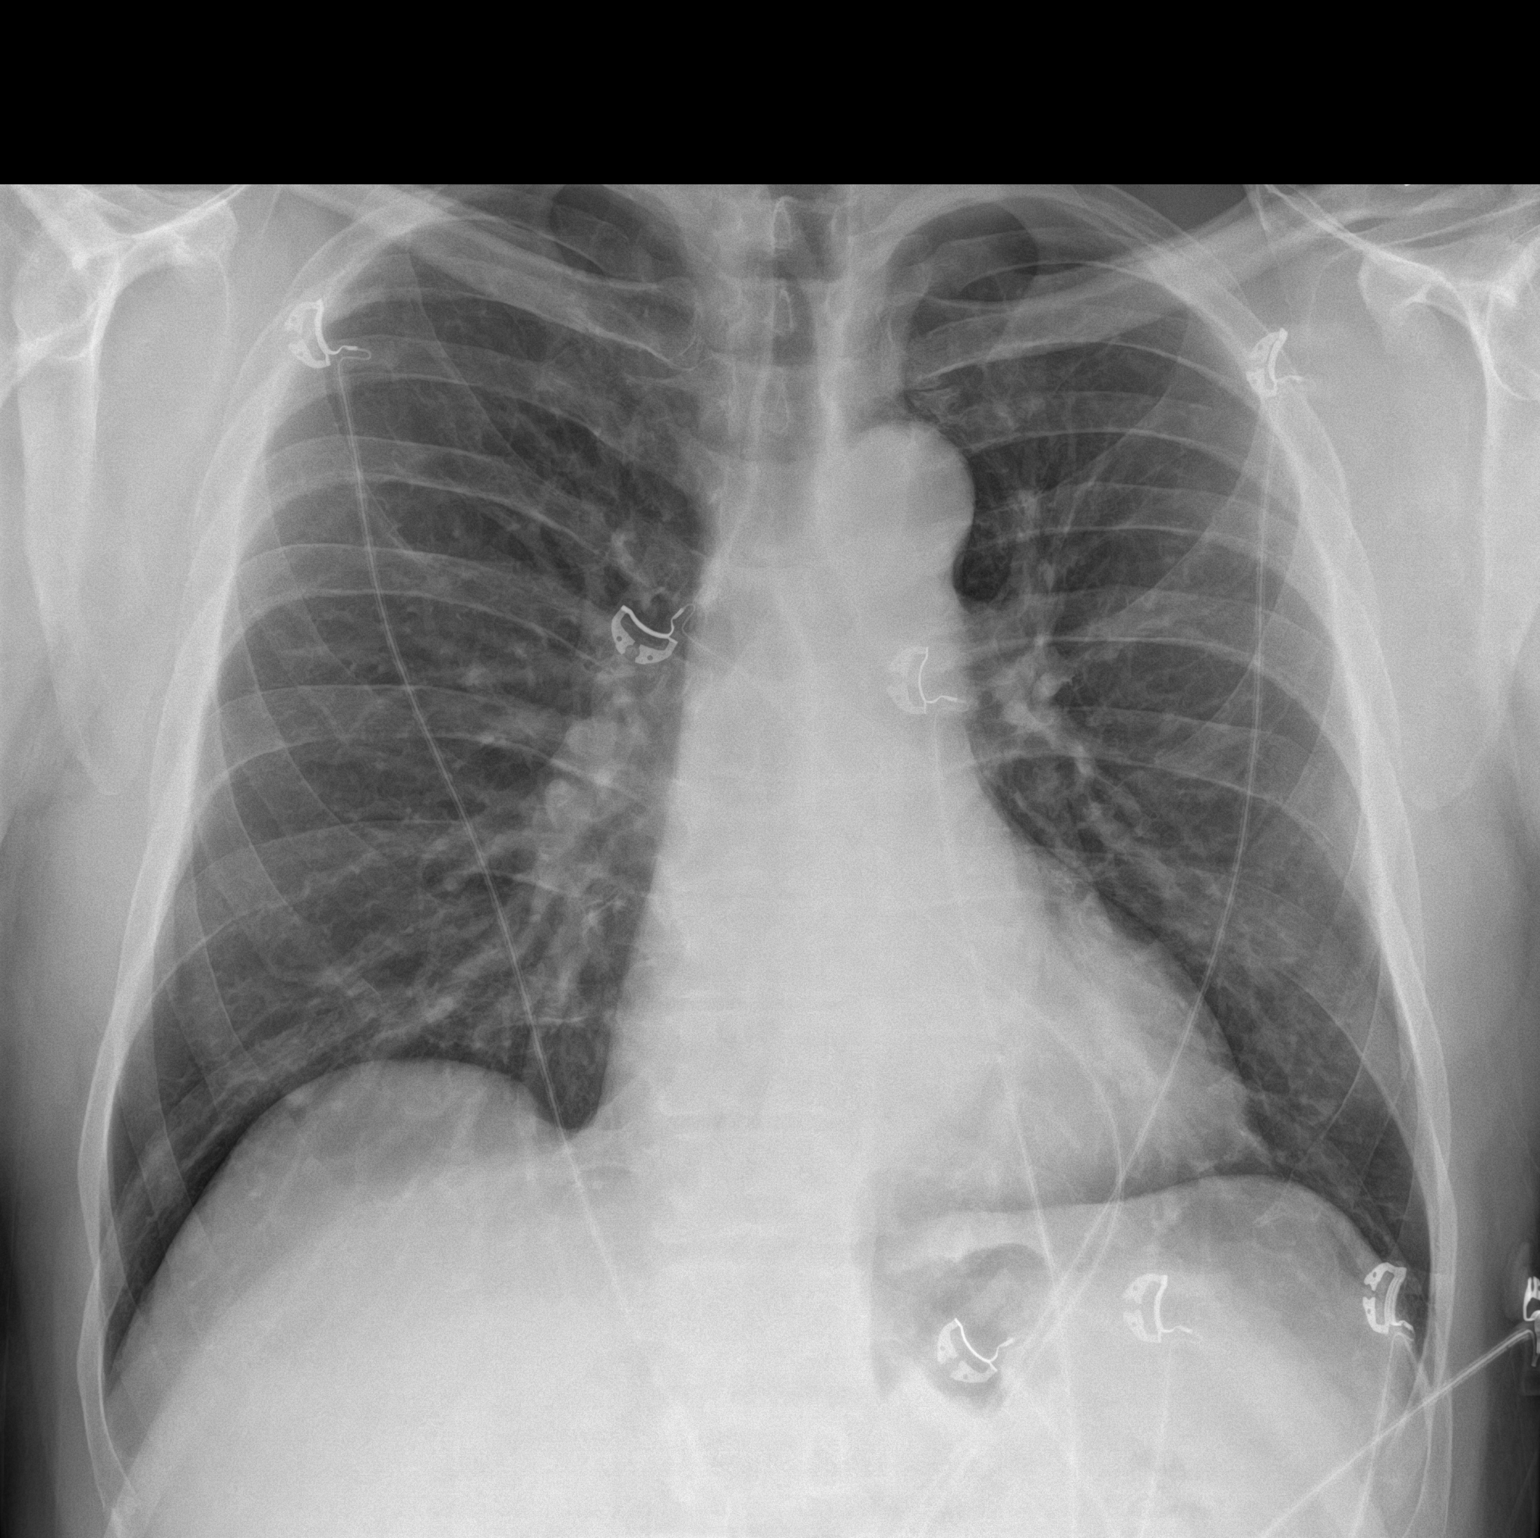

[chest lat]
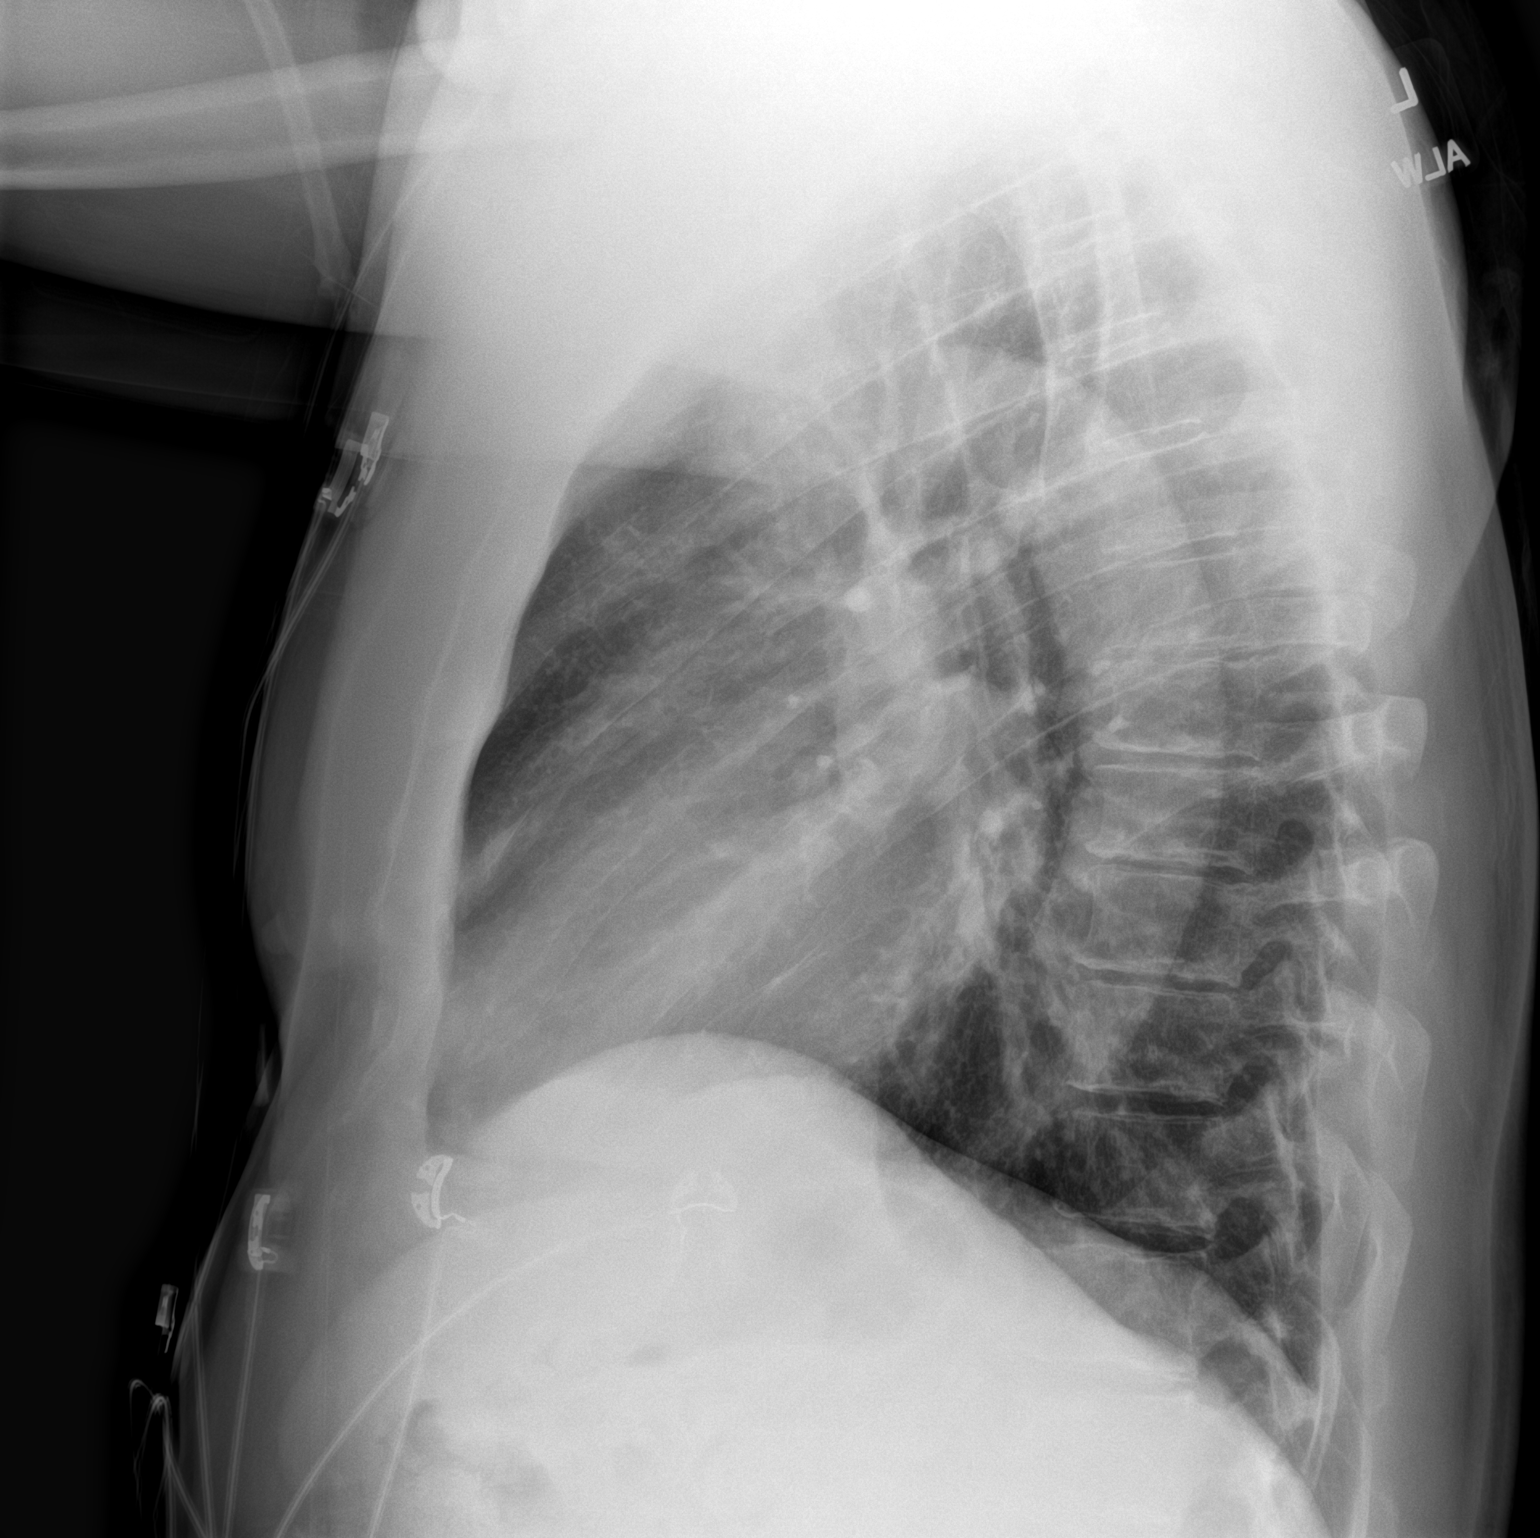

[2 of 2 positions shown; findings below may reference images not displayed]

FINDINGS: The heart size and mediastinal contours are within normal limits.
Both lungs are clear. The visualized skeletal structures are
unremarkable.
IMPRESSION: No active cardiopulmonary disease.
